# Patient Record
Sex: Female | Born: 1985
Health system: Southern US, Community
[De-identification: ages and names within clinical notes are randomized; demographics above are authoritative.]

## PROBLEM LIST (undated history)

## (undated) DIAGNOSIS — F419 Anxiety disorder, unspecified: Secondary | ICD-10-CM

## (undated) DIAGNOSIS — R002 Palpitations: Secondary | ICD-10-CM

## (undated) DIAGNOSIS — R200 Anesthesia of skin: Secondary | ICD-10-CM

## (undated) HISTORY — DX: Palpitations: R00.2

## (undated) HISTORY — PX: KNEE SURGERY: SHX244

## (undated) HISTORY — DX: Anxiety disorder, unspecified: F41.9

## (undated) HISTORY — DX: Anesthesia of skin: R20.0

---

## 2004-10-25 ENCOUNTER — Ambulatory Visit: Payer: Self-pay | Admitting: Family Medicine

## 2004-11-19 ENCOUNTER — Ambulatory Visit: Payer: Self-pay | Admitting: Family Medicine

## 2004-11-21 ENCOUNTER — Ambulatory Visit: Payer: Self-pay | Admitting: Family Medicine

## 2004-11-21 ENCOUNTER — Emergency Department (HOSPITAL_COMMUNITY): Admission: EM | Admit: 2004-11-21 | Discharge: 2004-11-21 | Payer: Self-pay | Admitting: Emergency Medicine

## 2014-07-12 ENCOUNTER — Encounter: Payer: Self-pay | Admitting: *Deleted

## 2014-07-12 ENCOUNTER — Emergency Department
Admission: EM | Admit: 2014-07-12 | Discharge: 2014-07-12 | Disposition: A | Discharge status: Home / Self Care | Payer: Managed Care, Other (non HMO) | Source: Home / Self Care | Attending: Emergency Medicine | Admitting: Emergency Medicine

## 2014-07-12 DIAGNOSIS — T63444A Toxic effect of venom of bees, undetermined, initial encounter: Secondary | ICD-10-CM

## 2014-07-12 MED ORDER — PREDNISONE 10 MG PO TABS: ORAL_TABLET | ORAL | Status: DC

## 2014-07-12 NOTE — ED Notes (Signed)
Pt c/o bee sting to her RT ankle with swelling x 3 days. She has taken IBF, Benadryl and applied ice.

## 2014-07-12 NOTE — Discharge Instructions (Signed)

## 2014-07-13 NOTE — ED Provider Notes (Signed)
CSN: [id number]     Arrival date & time 07/12/14  1845 History   First MD Initiated Contact with Patient 07/12/14 1907     Chief Complaint  Patient presents with  . Insect Bite     (Consider location/radiation/quality/duration/timing/severity/associated sxs/prior Treatment) Patient is a 29 y.o. female presenting with ankle pain. The history is provided by the patient. No language interpreter was used.  Ankle Pain Location:  Ankle Time since incident:  3 days Injury: no   Ankle location:  R ankle Pain details:    Quality:  Aching   Duration:  3 days   Progression:  Worsening Chronicity:  New Relieved by:  Nothing Worsened by:  Nothing tried Associated symptoms: swelling   Pt was stung by a bee on her right ankle.  Pt reports her ankle is swollen and painful.  History reviewed. No pertinent past medical history. Past Surgical History  Procedure Laterality Date  . Knee surgery     History reviewed. No pertinent family history. History  Substance Use Topics  . Smoking status: Never Smoker   . Smokeless tobacco: Not on file  . Alcohol Use: No   OB History    No data available     Review of Systems  Musculoskeletal: Positive for myalgias and joint swelling.  All other systems reviewed and are negative.     Allergies  Codeine and Morphine and related  Home Medications   Prior to Admission medications   Medication Sig Start Date End Date Taking? Authorizing Provider  predniSONE (DELTASONE) 10 MG tablet 6,5,4,3,2,1 taper 07/12/14   Lonia SkinnerLeslie K Sofia, PA-C   BP 116/80 mmHg  Pulse 79  Temp(Src) 98.4 F (36.9 C) (Oral)  Resp 16  Ht 5\' 10"  (1.778 m)  Wt 153 lb (69.4 kg)  BMI 21.95 kg/m2  SpO2 100%  LMP 06/22/2014 Physical Exam  Constitutional: She is oriented to person, place, and time. She appears well-developed and well-nourished.  Musculoskeletal: She exhibits tenderness.  Swollen right ankle, tender, no stingers.   Neurological: She is alert and oriented to  person, place, and time. She has normal reflexes.  Skin: There is erythema.  Nursing note and vitals reviewed.   ED Course  Procedures (including critical care time) Labs Review Labs Reviewed - No data to display  Imaging Review No results found.   EKG Interpretation None      MDM   Final diagnoses:  Bee sting, undetermined intent, initial encounter    Meds ordered this encounter  Medications  . predniSONE (DELTASONE) 10 MG tablet    Sig: 6,5,4,3,2,1 taper    Dispense:  21 tablet    Refill:  0    Order Specific Question:  Supervising Provider    Answer:  Georgina PillionMASSEY, DAVID [[id number]]    avs Benadryl See your Primary Md if symptoms persist.  Elson AreasLeslie K Sofia, PA-C 07/13/14 1703

## 2014-11-08 DIAGNOSIS — G43909 Migraine, unspecified, not intractable, without status migrainosus: Secondary | ICD-10-CM | POA: Insufficient documentation

## 2014-12-20 LAB — HM PAP SMEAR
HM Pap smear: NEGATIVE
HM Pap smear: NEGATIVE

## 2015-11-10 ENCOUNTER — Encounter: Payer: Self-pay | Admitting: Physician Assistant

## 2015-11-10 ENCOUNTER — Ambulatory Visit (INDEPENDENT_AMBULATORY_CARE_PROVIDER_SITE_OTHER): Payer: Managed Care, Other (non HMO) | Admitting: Physician Assistant

## 2015-11-10 VITALS — BP 121/80 | HR 89 | Ht 70.0 in | Wt 139.0 lb

## 2015-11-10 DIAGNOSIS — Z13220 Encounter for screening for lipoid disorders: Secondary | ICD-10-CM | POA: Diagnosis not present

## 2015-11-10 DIAGNOSIS — Z131 Encounter for screening for diabetes mellitus: Secondary | ICD-10-CM

## 2015-11-10 DIAGNOSIS — Z0000 Encounter for general adult medical examination without abnormal findings: Secondary | ICD-10-CM | POA: Diagnosis not present

## 2015-11-10 DIAGNOSIS — I498 Other specified cardiac arrhythmias: Secondary | ICD-10-CM | POA: Diagnosis not present

## 2015-11-10 DIAGNOSIS — Z23 Encounter for immunization: Secondary | ICD-10-CM

## 2015-11-10 LAB — LIPID PANEL
Cholesterol: 150 mg/dL (ref 125–200)
HDL: 62 mg/dL (ref >=46)
LDL CALC: 68 mg/dL (ref <130)
Total CHOL/HDL Ratio: 2.4 Ratio (ref <=5.0)
Triglycerides: 98 mg/dL (ref <150)
VLDL: 20 mg/dL (ref <30)

## 2015-11-10 LAB — COMPLETE METABOLIC PANEL WITH GFR
ALT: 24 U/L (ref 6–29)
AST: 21 U/L (ref 10–30)
Albumin: 4.5 g/dL (ref 3.6–5.1)
Alkaline Phosphatase: 51 U/L (ref 33–115)
BUN: 17 mg/dL (ref 7–25)
CHLORIDE: 103 mmol/L (ref 98–110)
CO2: 24 mmol/L (ref 20–31)
Calcium: 9.6 mg/dL (ref 8.6–10.2)
Creat: 0.88 mg/dL (ref 0.50–1.10)
GFR, EST NON AFRICAN AMERICAN: 87 mL/min (ref >=60)
GFR, Est African American: >89 mL/min (ref >=60)
Glucose, Bld: 86 mg/dL (ref 65–99)
Potassium: 4.2 mmol/L (ref 3.5–5.3)
Sodium: 138 mmol/L (ref 135–146)
Total Bilirubin: 1.1 mg/dL (ref 0.2–1.2)
Total Protein: 7.7 g/dL (ref 6.1–8.1)

## 2015-11-10 NOTE — Progress Notes (Signed)
Subjective:     Kelsey Dorsey is a 30 y.o. female and is here for a comprehensive physical exam. The patient reports no problems.  Social History   Social History  . Marital status: Single    Spouse name: N/A  . Number of children: N/A  . Years of education: N/A   Occupational History  . Not on file.   Social History Main Topics  . Smoking status: Never Smoker  . Smokeless tobacco: Never Used  . Alcohol use Yes  . Drug use: No  . Sexual activity: Not Currently   Other Topics Concern  . Not on file   Social History Narrative  . No narrative on file   Health Maintenance  Topic Date Due  . HIV Screening  01/14/1998  . TETANUS/TDAP  01/14/2002  . PAP SMEAR  01/15/2004  . INFLUENZA VACCINE  08/08/2015    The following portions of the patient's history were reviewed and updated as appropriate: allergies, current medications, past family history, past medical history, past social history, past surgical history and problem list.  Review of Systems A comprehensive review of systems was negative.   Objective:    BP 121/80   Pulse 89   Ht 5\' 10"  (1.778 m)   Wt 139 lb (63 kg)   LMP 11/01/2015   BMI 19.94 kg/m  General appearance: alert, cooperative and appears stated age Head: Normocephalic, without obvious abnormality, atraumatic Eyes: conjunctivae/corneas clear. PERRL, EOM's intact. Fundi benign. Ears: normal TM's and external ear canals both ears Nose: Nares normal. Septum midline. Mucosa normal. No drainage or sinus tenderness. Throat: lips, mucosa, and tongue normal; teeth and gums normal Neck: no adenopathy, no carotid bruit, no JVD, supple, symmetrical, trachea midline and thyroid not enlarged, symmetric, no tenderness/mass/nodules Back: symmetric, no curvature. ROM normal. No CVA tenderness. Lungs: clear to auscultation bilaterally Heart: regular rate and rhythm, S1, S2 normal, no murmur, click, rub or gallop Abdomen: soft, non-tender; bowel sounds normal; no  masses,  no organomegaly Extremities: extremities normal, atraumatic, no cyanosis or edema Pulses: 2+ and symmetric Skin: Skin color, texture, turgor normal. No rashes or lesions Lymph nodes: Cervical, supraclavicular, and axillary nodes normal. Neurologic: Alert and oriented X 3, normal strength and tone. Normal symmetric reflexes. Normal coordination and gait    Assessment:    Healthy female exam.      Plan:    Kelsey Dorsey.Kelsey Dorsey.Kelsey Dorsey was seen today for establish care and annual exam.  Diagnoses and all orders for this visit:  Routine physical examination -     Flu Vaccine QUAD 36+ mos PF IM (Fluarix & Fluzone Quad PF) -     Tdap vaccine greater than or equal to 7yo IM -     Lipid panel -     COMPLETE METABOLIC PANEL WITH GFR  Sinus arrhythmia seen on electrocardiogram  Screening for diabetes mellitus -     COMPLETE METABOLIC PANEL WITH GFR  Screening for lipid disorders -     Lipid panel   Discussed vitamin d 800- units and calcium 4 servings or 1500mg  daily.  Continue with regular exercise.   Discussed red flag symptoms of arrhthymias. Follow up as needed.  See After Visit Summary for Counseling Recommendations

## 2015-11-10 NOTE — Patient Instructions (Signed)

## 2016-01-31 ENCOUNTER — Other Ambulatory Visit: Payer: Self-pay | Admitting: Physician Assistant

## 2016-01-31 ENCOUNTER — Telehealth: Payer: Self-pay

## 2016-01-31 MED ORDER — CRYSELLE-28 0.3-30 MG-MCG PO TABS: 1.0000 | ORAL_TABLET | Freq: Every day | ORAL | 4 refills | Status: DC

## 2016-01-31 NOTE — Telephone Encounter (Signed)
Sent refills

## 2016-01-31 NOTE — Progress Notes (Signed)
Pt had pap in 11/2014. Was seen in office for CPE on 11/17. Ok for one year.

## 2016-01-31 NOTE — Telephone Encounter (Signed)
Kelsey Dorsey called for a refill on her birth control pill. Historical provider. Patient advised to call last office she had a PAP and have them fax us the results. Please advise.

## 2016-04-15 ENCOUNTER — Encounter: Payer: Self-pay | Admitting: Physician Assistant

## 2016-04-15 ENCOUNTER — Ambulatory Visit (INDEPENDENT_AMBULATORY_CARE_PROVIDER_SITE_OTHER): Payer: 59 | Admitting: Physician Assistant

## 2016-04-15 ENCOUNTER — Other Ambulatory Visit: Payer: Self-pay | Admitting: Physician Assistant

## 2016-04-15 VITALS — BP 125/78 | HR 86 | Ht 70.0 in | Wt 143.0 lb

## 2016-04-15 DIAGNOSIS — R0789 Other chest pain: Secondary | ICD-10-CM | POA: Diagnosis not present

## 2016-04-15 DIAGNOSIS — R002 Palpitations: Secondary | ICD-10-CM | POA: Diagnosis not present

## 2016-04-15 DIAGNOSIS — R079 Chest pain, unspecified: Secondary | ICD-10-CM

## 2016-04-15 LAB — CBC
HCT: 43.9 % (ref 35.0–45.0)
HEMOGLOBIN: 14.4 g/dL (ref 11.7–15.5)
MCH: 30.5 pg (ref 27.0–33.0)
MCHC: 32.8 g/dL (ref 32.0–36.0)
MCV: 93 fL (ref 80.0–100.0)
MPV: 9.5 fL (ref 7.5–12.5)
Platelets: 342 10*3/uL (ref 140–400)
RBC: 4.72 MIL/uL (ref 3.80–5.10)
RDW: 12.7 % (ref 11.0–15.0)
WBC: 7.3 10*3/uL (ref 3.8–10.8)

## 2016-04-15 LAB — BASIC METABOLIC PANEL WITH GFR
BUN: 9 mg/dL (ref 7–25)
CO2: 21 mmol/L (ref 20–31)
Calcium: 9.1 mg/dL (ref 8.6–10.2)
Chloride: 108 mmol/L (ref 98–110)
Creat: 0.82 mg/dL (ref 0.50–1.10)
GFR, Est African American: >89 mL/min (ref >=60)
GFR, Est Non African American: >89 mL/min (ref >=60)
Glucose, Bld: 82 mg/dL (ref 65–99)
Potassium: 4.6 mmol/L (ref 3.5–5.3)
Sodium: 142 mmol/L (ref 135–146)

## 2016-04-15 LAB — TSH: TSH: 1.21 m[IU]/L

## 2016-04-15 NOTE — Patient Instructions (Signed)
Prinzmetal angina.    

## 2016-04-15 NOTE — Progress Notes (Signed)
   Subjective:    Patient ID: Kelsey Dorsey, female    DOB: 1985/03/05, 31 y.o.   MRN: [id number]  HPI  Pt is a 31 yo female who presents to the clinic with mother to discuss sudden chest pain, pressure, tightness, and heart pounding that woke her up at 6:45 this morning. She felt like her left arm went numb during the attack. She felt some pain radiating into left back. She felt like she was being punched in the chest. She was coughing during attack but stopped after. She took 3 ASA and came to clinic. Last 1-2 hours but symptoms have resolved now. She only is sore. This has happened once before in October 2016. She went to cardiologist but did not do holter monitor. She denies any recent caffeine or medication changes that could have caused this. She is actively and exercises regularly. She denies any exertional chest pain.    Review of Systems    see HPI.  Objective:   Physical Exam  Constitutional: She is oriented to person, place, and time. She appears well-developed and well-nourished.  HENT:  Head: Normocephalic and atraumatic.  Cardiovascular: Normal rate, regular rhythm and normal heart sounds.   Pulmonary/Chest: Effort normal. She has wheezes. She exhibits no tenderness.  Neurological: She is alert and oriented to person, place, and time.  Psychiatric: She has a normal mood and affect. Her behavior is normal.          Assessment & Plan:  Marland KitchenMarland KitchenDiagnoses and all orders for this visit:  Palpitations -     EKG 12-Lead -     TSH -     CBC -     BASIC METABOLIC PANEL WITH GFR -     Ambulatory referral to Cardiology  Chest pain, unspecified type -     EKG 12-Lead -     TSH -     CBC -     BASIC METABOLIC PANEL WITH GFR -     Ambulatory referral to Cardiology  Chest tightness -     EKG 12-Lead -     TSH -     CBC -     BASIC METABOLIC PANEL WITH GFR -     Ambulatory referral to Cardiology   EKG today showed NSR with sinus arrhthymias. This is unchanged from past EKG.    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 discussed with patient. I would like to send to cardiology for there evaluation. Labs ordered to look for any thyroid issue.  Symptoms have resolved today. Ibuprofen as needed for chest wall tenderness.

## 2016-04-16 ENCOUNTER — Encounter: Payer: Self-pay | Admitting: Cardiology

## 2016-04-16 NOTE — Progress Notes (Signed)
Call pt: TSH is normal.  Kidney, liver, glucose looks great.  You are not anemic.

## 2016-04-17 ENCOUNTER — Encounter: Payer: Self-pay | Admitting: Cardiology

## 2016-04-17 ENCOUNTER — Ambulatory Visit: Payer: 59 | Admitting: Cardiology

## 2016-04-17 ENCOUNTER — Ambulatory Visit (INDEPENDENT_AMBULATORY_CARE_PROVIDER_SITE_OTHER): Payer: 59 | Admitting: Cardiology

## 2016-04-17 VITALS — BP 118/86 | HR 64 | Ht 70.0 in | Wt 146.1 lb

## 2016-04-17 DIAGNOSIS — R002 Palpitations: Secondary | ICD-10-CM

## 2016-04-17 DIAGNOSIS — R072 Precordial pain: Secondary | ICD-10-CM | POA: Diagnosis not present

## 2016-04-17 NOTE — Progress Notes (Signed)
Referring-Breeback, Jade L, PA-C Reason for referral-Palpitations and chest pain.  HPI: 31 year old female I am asked to consult on by Tandy Gaw for palpitations and chest pain. Laboratories 04/15/2016 showed TSH 1.21, potassium 4.6, hemoglobin 14.4. Patient has had 2 separate episodes one in October and one 2 days ago. She was awaking from sleep with sudden onset of heart racing and chest pain. There was dyspnea as well. No presyncope or syncope. Each episode lasted approximately 45 minutes and resolved with coughing. She was seen at [facility] after the first episode and was told it may have been an anxiety attack. Note her symptoms resolved prior to arrival. She was seen in primary care office after the most recent episode and again her symptoms had resolved. She otherwise does not have dyspnea on exertion, orthopnea, PND, pedal edema, exertional chest pain or syncope.  Current Outpatient Prescriptions  Medication Sig Dispense Refill  . CRYSELLE-28 0.3-30 MG-MCG tablet Take 1 tablet by mouth daily. 3 Package 4   No current facility-administered medications for this visit.     Allergies  Allergen Reactions  . Codeine Itching  . Cephalexin Other (See Comments)  . Morphine And Related Other (See Comments)     Past Medical History:  Diagnosis Date  . Palpitations     Past Surgical History:  Procedure Laterality Date  . KNEE SURGERY      Social History   Social History  . Marital status: Single    Spouse name: N/A  . Number of children: N/A  . Years of education: N/A   Occupational History  .      Financial planner for [employer]   Social History Main Topics  . Smoking status: Never Smoker  . Smokeless tobacco: Never Used  . Alcohol use Yes     Comment: Rare  . Drug use: No  . Sexual activity: Not Currently   Other Topics Concern  . Not on file   Social History Narrative  . No narrative on file    Family History  Problem Relation Age of Onset  .  Hyperlipidemia Mother   . Hypertension Mother   . Diabetes Mother   . Heart attack Mother   . Depression Father     ROS: no fevers or chills, productive cough, hemoptysis, dysphasia, odynophagia, melena, hematochezia, dysuria, hematuria, rash, seizure activity, orthopnea, PND, pedal edema, claudication. Remaining systems are negative.  Physical Exam:   Blood pressure 118/86, pulse 64, height  (1.778 m), weight 146 lb 1.9 oz (66.3 kg).  General:  Well developed/well nourished in NAD Skin warm/dry Patient not depressed No peripheral clubbing Back-normal HEENT-normal/normal eyelids Neck supple/normal carotid upstroke bilaterally; no bruits; no JVD; no thyromegaly chest - CTA/ normal expansion CV - RRR/normal S1 and S2; no murmurs, rubs or gallops;  PMI nondisplaced Abdomen -NT/ND, no HSM, no mass, + bowel sounds, no bruit 2+ femoral pulses, no bruits Ext-no edema, chords, 2+ DP Neuro-grossly nonfocal  ECG - sinus rhythm at a rate of 64. Right axis deviation. RV conduction delay. personally reviewed  A/P  1 palpitations-symptoms sound likely to be supraventricular tachycardia. They have been infrequent to date. I have recommended alivecor see if we can capture an episode of palpitations. She has SVT will add a beta blocker versus refer for ablation. Schedule echocardiogram to assess LV function. We discussed Valsalva maneuver to potentially terminate episodes if they occur.  2. Chest pain-symptoms only occur with palpitations but no other exertional symptoms and electrocardiogram normal.  Schedule echocardiogram to assess LV function.  Olga Millers, MD

## 2016-04-17 NOTE — Patient Instructions (Signed)
Medication Instructions:   NO CHANGE  Testing/Procedures:  Your physician has requested that you have an echocardiogram. Echocardiography is a painless test that uses sound waves to create images of your heart. It provides your doctor with information about the size and shape of your heart and how well your heart's chambers and valves are working. This procedure takes approximately one hour. There are no restrictions for this procedure.    Follow-Up:  Your physician wants you to follow-up in: 6 MONTHS WITH DR CRENSHAW You will receive a reminder letter in the mail two months in advance. If you don't receive a letter, please call our office to schedule the follow-up appointment.      

## 2016-04-30 ENCOUNTER — Encounter: Payer: Self-pay | Admitting: Physician Assistant

## 2016-05-01 ENCOUNTER — Ambulatory Visit (HOSPITAL_COMMUNITY): Payer: 59 | Attending: Cardiology

## 2016-05-01 ENCOUNTER — Other Ambulatory Visit: Payer: Self-pay

## 2016-05-01 DIAGNOSIS — I341 Nonrheumatic mitral (valve) prolapse: Secondary | ICD-10-CM | POA: Insufficient documentation

## 2016-05-01 DIAGNOSIS — R002 Palpitations: Secondary | ICD-10-CM | POA: Diagnosis not present

## 2016-07-18 ENCOUNTER — Encounter: Payer: Self-pay | Admitting: Family Medicine

## 2016-07-18 ENCOUNTER — Ambulatory Visit (INDEPENDENT_AMBULATORY_CARE_PROVIDER_SITE_OTHER): Payer: 59

## 2016-07-18 ENCOUNTER — Ambulatory Visit (INDEPENDENT_AMBULATORY_CARE_PROVIDER_SITE_OTHER): Payer: 59 | Admitting: Family Medicine

## 2016-07-18 VITALS — BP 116/84 | HR 86 | Wt 145.0 lb

## 2016-07-18 DIAGNOSIS — R079 Chest pain, unspecified: Secondary | ICD-10-CM | POA: Diagnosis not present

## 2016-07-18 DIAGNOSIS — R0789 Other chest pain: Secondary | ICD-10-CM | POA: Diagnosis not present

## 2016-07-18 DIAGNOSIS — R002 Palpitations: Secondary | ICD-10-CM

## 2016-07-18 LAB — COMPLETE METABOLIC PANEL WITH GFR
ALT: 16 U/L (ref 6–29)
AST: 16 U/L (ref 10–30)
Albumin: 4.3 g/dL (ref 3.6–5.1)
Alkaline Phosphatase: 54 U/L (ref 33–115)
BILIRUBIN TOTAL: 0.6 mg/dL (ref 0.2–1.2)
BUN: 11 mg/dL (ref 7–25)
CHLORIDE: 101 mmol/L (ref 98–110)
CO2: 24 mmol/L (ref 20–31)
Calcium: 9 mg/dL (ref 8.6–10.2)
Creat: 0.77 mg/dL (ref 0.50–1.10)
GFR, Est Non African American: >89 mL/min (ref >=60)
GLUCOSE: 79 mg/dL (ref 65–99)
Potassium: 3.9 mmol/L (ref 3.5–5.3)
Sodium: 137 mmol/L (ref 135–146)
Total Protein: 7.4 g/dL (ref 6.1–8.1)

## 2016-07-18 LAB — CBC
HCT: 42.2 % (ref 35.0–45.0)
Hemoglobin: 13.6 g/dL (ref 11.7–15.5)
MCH: 30.6 pg (ref 27.0–33.0)
MCHC: 32.2 g/dL (ref 32.0–36.0)
MCV: 94.8 fL (ref 80.0–100.0)
MPV: 9.2 fL (ref 7.5–12.5)
Platelets: 366 10*3/uL (ref 140–400)
RBC: 4.45 MIL/uL (ref 3.80–5.10)
RDW: 13.4 % (ref 11.0–15.0)
WBC: 10.1 10*3/uL (ref 3.8–10.8)

## 2016-07-18 LAB — MAGNESIUM: MAGNESIUM: 1.8 mg/dL (ref 1.5–2.5)

## 2016-07-18 LAB — EKG 12-LEAD

## 2016-07-18 MED ORDER — OMEPRAZOLE 40 MG PO CPDR: 40.0000 mg | DELAYED_RELEASE_CAPSULE | Freq: Every day | ORAL | 3 refills | Status: DC

## 2016-07-18 MED ORDER — SUCRALFATE 1 G PO TABS: 1.0000 g | ORAL_TABLET | Freq: Four times a day (QID) | ORAL | 0 refills | Status: DC

## 2016-07-18 MED ORDER — ONDANSETRON HCL 4 MG PO TABS: 4.0000 mg | ORAL_TABLET | Freq: Three times a day (TID) | ORAL | 0 refills | Status: DC | PRN

## 2016-07-18 NOTE — Progress Notes (Signed)
Kelsey Dorsey is a 31 y.o. female who [facility]: [facility] today for chest pain.  Patient reports 2.5 hours of stabbing chest pain localized to the substernal region. She endorses some shortness of breath, nausea, and 1 episode of emesis. The patient was not exerting herself when the pain began and it did not resolve with rest. She rates the pain as 8/10 in severity. She denies any change in pain with body position or when taking a deep breath. She denies any palpitations, diaphoresis, fevers, chills, or weight loss. Patient denies any jaw or arm radiation of the pain. She denies any previous episodes similar to this one, but reports that she has previously been told that she has an irregular heart beat and atrial fibrillation. She denies any recent injuries, periods of immobilization, or travel. Patient denies any recent viral illnesses, sick contacts, changes in stool frequency, headaches, vision changes, changes in urination, or cough.   Past Medical History:  Diagnosis Date  . Palpitations    Past Surgical History:  Procedure Laterality Date  . KNEE SURGERY     Social History  Substance Use Topics  . Smoking status: Never Smoker  . Smokeless tobacco: Never Used  . Alcohol use Yes     Comment: Rare   family history includes Depression in her father; Diabetes in her mother; Heart attack in her mother; Hyperlipidemia in her mother; Hypertension in her mother.  ROS as above:  Medications: Current Outpatient Prescriptions  Medication Sig Dispense Refill  . CRYSELLE-28 0.3-30 MG-MCG tablet Take 1 tablet by mouth daily. 3 Package 4  . omeprazole (PRILOSEC) 40 MG capsule Take 1 capsule (40 mg total) by mouth daily. 30 capsule 3  . ondansetron (ZOFRAN) 4 MG tablet Take 1 tablet (4 mg total) by mouth every 8 (eight) hours as needed for nausea or vomiting. 20 tablet 0    . sucralfate (CARAFATE) 1 g tablet Take 1 tablet (1 g total) by mouth 4 (four) times daily. 120 tablet 0   No current facility-administered medications for this visit.    Allergies  Allergen Reactions  . Codeine Itching  . Cephalexin Other (See Comments)  . Morphine And Related Other (See Comments)    Health Maintenance Health Maintenance  Topic Date Due  . HIV Screening  01/14/1998  . INFLUENZA VACCINE  08/07/2016  . PAP SMEAR  12/19/2017  . TETANUS/TDAP  11/09/2025     Exam:  BP 116/84   Pulse 86   Wt 145 lb (65.8 kg)   LMP 07/16/2016   SpO2 99%   BMI 20.81 kg/m  Gen: Well NAD HEENT: EOMI,  MMM Lungs: Normal work of breathing. CTABL Heart: RRR no MRG Abd: NABS, Soft. Nondistended, Nontender Exts: Brisk capillary refill, warm and well perfused.   Twelve-lead EKG shows normal sinus rhythm at 70 bpm. Slight rightward axis. No ST segment elevation or depression. Normal T waves. QTC 460. EKG is not significantly changed from EKG from April 2018.  Patient was given a GI cocktail which did not help much at all.  No results found for this or any previous visit (from the past 72 hour(s)). Dg Chest 2 View  Result Date: 07/18/2016 CLINICAL DATA:  Mid and left side chest pain beginning this morning. EXAM: CHEST  2 VIEW COMPARISON:  PA and lateral chest 11/21/2004. FINDINGS: The lungs are clear. Heart size is normal. No pneumothorax or pleural effusion. Mild pectus deformity is  noted. No acute bony abnormality. IMPRESSION: No acute disease. Electronically Signed   By: Drusilla Kanner M.D.   On: 07/18/2016 11:47      Assessment and Plan: 31 y.o. female with chest pain. Given the acute onset of her pain and associated symptoms, cardiac etiologies should be further evaluated. A chest x-ray and EKG were performed. Chest x-ray was unremarkable, currently pending final read by radiology. EKG demonstrated normal sinus rhythm with right axis deviation which is consistent with  previous EKG dated 04/24/2016. A basic metabolic panel will also be done to evaluate for any electrolyte abnormalities. Other potential causes of chest pain include pulmonary embolism, GI, and musculoskeletal etiologies. PE is less likely given patient's normal lower extremity exam and overall lack of risk factors, although she does take OCPs. A d-dimer will be done to further assess and potentially rule out. Patient was given GI cocktail and monitored for response to treatment. She did not improve with GI cocktail I'm doubtful for esophagitis however think is reasonable to treat with omeprazole and Carafate. Additionally she's had some vomiting and therefore I'll prescribe Zofran as well.  She will continue to wear cardiac monitor from her cardiologist for the remainder of the prescribed period. She should undergo a cardiac stress test at some point, but this is not necessary for this visit.     Orders Placed This Encounter  Procedures  . DG Chest 2 View    Order Specific Question:   Reason for exam:    Answer:   Cough, assess intra-thoracic pathology    Order Specific Question:   Is the patient pregnant?    Answer:   No    Order Specific Question:   Preferred imaging location?    Answer:   [facility]  . CBC  . COMPLETE METABOLIC PANEL WITH GFR  . D-dimer, quantitative (not at [facility])  . Magnesium   Meds ordered this encounter  Medications  . omeprazole (PRILOSEC) 40 MG capsule    Sig: Take 1 capsule (40 mg total) by mouth daily.    Dispense:  30 capsule    Refill:  3  . ondansetron (ZOFRAN) 4 MG tablet    Sig: Take 1 tablet (4 mg total) by mouth every 8 (eight) hours as needed for nausea or vomiting.    Dispense:  20 tablet    Refill:  0  . sucralfate (CARAFATE) 1 g tablet    Sig: Take 1 tablet (1 g total) by mouth 4 (four) times daily.    Dispense:  120 tablet    Refill:  0     Discussed warning signs or symptoms. Please see discharge instructions. Patient expresses  understanding.  I spent 40 minutes with this patient, greater than 50% was face-to-face time counseling regarding the above diagnosis.

## 2016-07-18 NOTE — Patient Instructions (Addendum)
Thank you for coming in today. Please return to clinic next week.  Return sooner if needed.  Call or go to the emergency room if you get worse, have trouble breathing, have chest pains, or palpitations.   Get labs today.  Start carafte and omeprazole now.  USe zofran as needed.      Nonspecific Chest Pain Chest pain can be caused by many different conditions. There is always a chance that your pain could be related to something serious, such as a heart attack or a blood clot in your lungs. Chest pain can also be caused by conditions that are not life-threatening. If you have chest pain, it is very important to follow up with your health care provider. What are the causes? Causes of this condition include:  Heartburn.  Pneumonia or bronchitis.  Anxiety or stress.  Inflammation around your heart (pericarditis) or lung (pleuritis or pleurisy).  A blood clot in your lung.  A collapsed lung (pneumothorax). This can develop suddenly on its own (spontaneous pneumothorax) or from trauma to the chest.  Shingles infection (varicella-zoster virus).  Heart attack.  Damage to the bones, muscles, and cartilage that make up your chest wall. This can include: ? Bruised bones due to injury. ? Strained muscles or cartilage due to frequent or repeated coughing or overwork. ? Fracture to one or more ribs. ? Sore cartilage due to inflammation (costochondritis).  What increases the risk? Risk factors for this condition may include:  Activities that increase your risk for trauma or injury to your chest.  Respiratory infections or conditions that cause frequent coughing.  Medical conditions or overeating that can cause heartburn.  Heart disease or family history of heart disease.  Conditions or health behaviors that increase your risk of developing a blood clot.  Having had chicken pox (varicella zoster).  What are the signs or symptoms? Chest pain can feel like:  Burning or tingling  on the surface of your chest or deep in your chest.  Crushing, pressure, aching, or squeezing pain.  Dull or sharp pain that is worse when you move, cough, or take a deep breath.  Pain that is also felt in your back, neck, shoulder, or arm, or pain that spreads to any of these areas.  Your chest pain may come and go, or it may stay constant. How is this diagnosed? Lab tests or other studies may be needed to find the cause of your pain. Your health care provider may have you take a test called an ECG (electrocardiogram). An ECG records your heartbeat patterns at the time the test is performed. You may also have other tests, such as:  Transthoracic echocardiogram (TTE). In this test, sound waves are used to create a picture of the heart structures and to look at how blood flows through your heart.  Transesophageal echocardiogram (TEE).This is a more advanced imaging test that takes images from inside your body. It allows your health care provider to see your heart in finer detail.  Cardiac monitoring. This allows your health care provider to monitor your heart rate and rhythm in real time.  Holter monitor. This is a portable device that records your heartbeat and can help to diagnose abnormal heartbeats. It allows your health care provider to track your heart activity for several days, if needed.  Stress tests. These can be done through exercise or by taking medicine that makes your heart beat more quickly.  Blood tests.  Other imaging tests.  How is this treated? Treatment  depends on what is causing your chest pain. Treatment may include:  Medicines. These may include: ? Acid blockers for heartburn. ? Anti-inflammatory medicine. ? Pain medicine for inflammatory conditions. ? Antibiotic medicine, if an infection is present. ? Medicines to dissolve blood clots. ? Medicines to treat coronary artery disease (CAD).  Supportive care for conditions that do not require medicines. This  may include: ? Resting. ? Applying heat or cold packs to injured areas. ? Limiting activities until pain decreases.  Follow these instructions at home: Medicines  If you were prescribed an antibiotic, take it as told by your health care provider. Do not stop taking the antibiotic even if you start to feel better.  Take over-the-counter and prescription medicines only as told by your health care provider. Lifestyle  Do not use any products that contain nicotine or tobacco, such as cigarettes and e-cigarettes. If you need help quitting, ask your health care provider.  Do not drink alcohol.  Make lifestyle changes as directed by your health care provider. These may include: ? Getting regular exercise. Ask your health care provider to suggest some activities that are safe for you. ? Eating a heart-healthy diet. A registered dietitian can help you to learn healthy eating options. ? Maintaining a healthy weight. ? Managing diabetes, if necessary. ? Reducing stress, such as with yoga or relaxation techniques. General instructions  Avoid any activities that bring on chest pain.  If heartburn is the cause for your chest pain, raise (elevate) the head of your bed about 6 inches (15 cm) by putting blocks under the legs. Sleeping with more pillows does not effectively relieve heartburn because it only changes the position of your head.  Keep all follow-up visits as told by your health care provider. This is important. This includes any further testing if your chest pain does not go away. Contact a health care provider if:  Your chest pain does not go away.  You have a rash with blisters on your chest.  You have a fever.  You have chills. Get help right away if:  Your chest pain is worse.  You have a cough that gets worse, or you cough up blood.  You have severe pain in your abdomen.  You have severe weakness.  You faint.  You have sudden, unexplained chest discomfort.  You  have sudden, unexplained discomfort in your arms, back, neck, or jaw.  You have shortness of breath at any time.  You suddenly start to sweat, or your skin gets clammy.  You feel nauseous or you vomit.  You suddenly feel light-headed or dizzy.  Your heart begins to beat quickly, or it feels like it is skipping beats. These symptoms may represent a serious problem that is an emergency. Do not wait to see if the symptoms will go away. Get medical help right away. Call your local emergency services (911 in the [place] Do not drive yourself to the hospital. This information is not intended to replace advice given to you by your health care provider. Make sure you discuss any questions you have with your health care provider. Document Released: 10/03/2004 Document Revised: 09/18/2015 Document Reviewed: 09/18/2015 Elsevier Interactive Patient Education  2017 [employer].

## 2016-07-19 LAB — D-DIMER, QUANTITATIVE: D-Dimer, Quant: 0.35 mcg/mL FEU (ref <0.50)

## 2016-07-25 ENCOUNTER — Encounter: Payer: Self-pay | Admitting: Family Medicine

## 2016-07-25 ENCOUNTER — Ambulatory Visit (INDEPENDENT_AMBULATORY_CARE_PROVIDER_SITE_OTHER): Payer: 59 | Admitting: Family Medicine

## 2016-07-25 VITALS — BP 118/82 | HR 88 | Ht 70.0 in | Wt 147.0 lb

## 2016-07-25 DIAGNOSIS — R002 Palpitations: Secondary | ICD-10-CM | POA: Diagnosis not present

## 2016-07-25 MED ORDER — METOPROLOL SUCCINATE ER 25 MG PO TB24: 25.0000 mg | ORAL_TABLET | Freq: Every day | ORAL | 3 refills | Status: DC

## 2016-07-25 NOTE — Patient Instructions (Addendum)
Thank you for coming in today. We will keep an eye on symptoms.  Return as needed.   Lets try low dose metoprolol daily.  Let me know if you feel bad, cant work out, get lightheaded or dizzy.   Follow up with cardiology.     Metoprolol extended-release tablets What is this medicine? METOPROLOL (me TOE proe lole) is a beta-blocker. Beta-blockers reduce the workload on the heart and help it to beat more regularly. This medicine is used to treat high blood pressure and to prevent chest pain. It is also used to after a heart attack and to prevent an additional heart attack from occurring. This medicine may be used for other purposes; ask your health care provider or pharmacist if you have questions. COMMON BRAND NAME(S): toprol, Toprol XL What should I tell my health care provider before I take this medicine? They need to know if you have any of these conditions: -diabetes -heart or vessel disease like slow heart rate, worsening heart failure, heart block, sick sinus syndrome or Raynaud's disease -kidney disease -liver disease -lung or breathing disease, like asthma or emphysema -pheochromocytoma -thyroid disease -an unusual or allergic reaction to metoprolol, other beta-blockers, medicines, foods, dyes, or preservatives -pregnant or trying to get pregnant -breast-feeding How should I use this medicine? Take this medicine by mouth with a glass of water. Follow the directions on the prescription label. Do not crush or chew. Take this medicine with or immediately after meals. Take your doses at regular intervals. Do not take more medicine than directed. Do not stop taking this medicine suddenly. This could lead to serious heart-related effects. Talk to your pediatrician regarding the use of this medicine in children. While this drug may be prescribed for children as young as 6 years for selected conditions, precautions do apply. Overdosage: If you think you have taken too much of this  medicine contact a poison control center or emergency room at once. NOTE: This medicine is only for you. Do not share this medicine with others. What if I miss a dose? If you miss a dose, take it as soon as you can. If it is almost time for your next dose, take only that dose. Do not take double or extra doses. What may interact with this medicine? This medicine may interact with the following medications: -certain medicines for blood pressure, heart disease, irregular heart beat -certain medicines for depression, like monoamine oxidase (MAO) inhibitors, fluoxetine, or paroxetine -clonidine -dobutamine -epinephrine -isoproterenol -reserpine This list may not describe all possible interactions. Give your health care provider a list of all the medicines, herbs, non-prescription drugs, or dietary supplements you use. Also tell them if you smoke, drink alcohol, or use illegal drugs. Some items may interact with your medicine. What should I watch for while using this medicine? Visit your doctor or health care professional for regular check ups. Contact your doctor right away if your symptoms worsen. Check your blood pressure and pulse rate regularly. Ask your health care professional what your blood pressure and pulse rate should be, and when you should contact them. You may get drowsy or dizzy. Do not drive, use machinery, or do anything that needs mental alertness until you know how this medicine affects you. Do not sit or stand up quickly, especially if you are an older patient. This reduces the risk of dizzy or fainting spells. Contact your doctor if these symptoms continue. Alcohol may interfere with the effect of this medicine. Avoid alcoholic drinks. What side effects  may I notice from receiving this medicine? Side effects that you should report to your doctor or health care professional as soon as possible: -allergic reactions like skin rash, itching or hives -cold or numb hands or  feet -depression -difficulty breathing -faint -fever with sore throat -irregular heartbeat, chest pain -rapid weight gain -swollen legs or ankles Side effects that usually do not require medical attention (report to your doctor or health care professional if they continue or are bothersome): -anxiety or nervousness -change in sex drive or performance -dry skin -headache -nightmares or trouble sleeping -short term memory loss -stomach upset or diarrhea -unusually tired This list may not describe all possible side effects. Call your doctor for medical advice about side effects. You may report side effects to FDA at 1-800-FDA-1088. Where should I keep my medicine? Keep out of the reach of children. Store at room temperature between 15 and 30 degrees C (59 and 86 degrees F). Throw away any unused medicine after the expiration date. NOTE: This sheet is a summary. It may not cover all possible information. If you have questions about this medicine, talk to your doctor, pharmacist, or health care provider.  2018 Elsevier/Gold Standard (2012-08-28 14:41:37)

## 2016-07-25 NOTE — Progress Notes (Signed)
Kelsey Dorsey is a 31 y.o. female who presents to [facility] today for follow-up of chest pain.  Patient presents for follow-up after an episode of chest pain 1 week ago. Patient denies any additional episodes of chest pain. She resumed her normal activities after 1 day and has not had any issues maintaining her usual work schedule or workout regimen. Patient reports that she took omeprazole, ondansetron, and sucralfate for 1 day, but did not feel she needed these medications and has since quite taking them. Patient denies any shortness of breath, vision changes, lightheadedness, headache, syncope, reflux, abdominal pain, changes in bowel movements, or leg swelling.   Her brother is a Teacher, early years/pre and suggested that she consider a beta blocker.  Past Medical History:  Diagnosis Date  . Palpitations    Past Surgical History:  Procedure Laterality Date  . KNEE SURGERY     Social History  Substance Use Topics  . Smoking status: Never Smoker  . Smokeless tobacco: Never Used  . Alcohol use Yes     Comment: Rare   family history includes Depression in her father; Diabetes in her mother; Heart attack in her mother; Hyperlipidemia in her mother; Hypertension in her mother.  ROS as above:  Medications: Current Outpatient Prescriptions  Medication Sig Dispense Refill  . CRYSELLE-28 0.3-30 MG-MCG tablet Take 1 tablet by mouth daily. 3 Package 4  . ondansetron (ZOFRAN) 4 MG tablet Take 1 tablet (4 mg total) by mouth every 8 (eight) hours as needed for nausea or vomiting. 20 tablet 0  . metoprolol succinate (TOPROL-XL) 25 MG 24 hr tablet Take 1 tablet (25 mg total) by mouth daily. 90 tablet 3   No current facility-administered medications for this visit.    Allergies  Allergen Reactions  . Codeine Itching  . Cephalexin Other (See Comments)  . Morphine And Related Other  (See Comments)    Health Maintenance Health Maintenance  Topic Date Due  . HIV Screening  01/14/1998  . INFLUENZA VACCINE  08/07/2016  . PAP SMEAR  12/19/2017  . TETANUS/TDAP  11/09/2025     Exam:  BP 118/82   Pulse 88   Ht 5\' 10"  (1.778 m)   Wt 147 lb (66.7 kg)   LMP 07/16/2016   BMI 21.09 kg/m  Gen: Well NAD HEENT: EOMI,  MMM Lungs: Normal work of breathing. CTABL Heart: RRR, normal S1 and S2, no MRG Abd: NABS, Soft. Nondistended, Nontender Exts: Brisk capillary refill, warm and well perfused.    No results found for this or any previous visit (from the past 72 hour(s)). No results found.    Assessment and Plan: 31 y.o. female presenting for 1 week follow-up of chest pain/palpitations. Patient has not had any additional episodes of chest pain. No further workup is indicated at this time. She has scheduled an appointment with a cardiologist in October.   I think it is reasonable to use low-dose metoprolol to prevent what we presume are nonsustained SVT.  This was suggested in a note by Dr. Jens Som on April.  Patient was started on metoprolol succinate 25 mg and instructed to stop this medication if she experiences any lightheadedness or dizziness.    No orders of the defined types were placed in this encounter.  Meds ordered this encounter  Medications  . metoprolol succinate (TOPROL-XL) 25 MG 24 hr tablet    Sig: Take 1 tablet (25 mg total) by mouth daily.  Dispense:  90 tablet    Refill:  3     Discussed warning signs or symptoms. Please see discharge instructions. Patient expresses understanding.

## 2016-08-02 NOTE — Addendum Note (Signed)
Addended by: Baird KayUGLAS, Esgar Barnick M on: 08/02/2016 09:51 AM   Modules accepted: Orders

## 2016-08-14 ENCOUNTER — Other Ambulatory Visit: Payer: Self-pay | Admitting: Family Medicine

## 2016-09-07 ENCOUNTER — Other Ambulatory Visit: Payer: Self-pay | Admitting: Family Medicine

## 2016-10-27 ENCOUNTER — Encounter: Payer: Self-pay | Admitting: Emergency Medicine

## 2016-10-27 ENCOUNTER — Emergency Department (INDEPENDENT_AMBULATORY_CARE_PROVIDER_SITE_OTHER)
Admission: EM | Admit: 2016-10-27 | Discharge: 2016-10-27 | Disposition: A | Discharge status: Home / Self Care | Payer: 59 | Source: Home / Self Care | Attending: Family Medicine | Admitting: Family Medicine

## 2016-10-27 DIAGNOSIS — H6693 Otitis media, unspecified, bilateral: Secondary | ICD-10-CM

## 2016-10-27 DIAGNOSIS — J029 Acute pharyngitis, unspecified: Secondary | ICD-10-CM

## 2016-10-27 DIAGNOSIS — J069 Acute upper respiratory infection, unspecified: Secondary | ICD-10-CM

## 2016-10-27 MED ORDER — AMOXICILLIN-POT CLAVULANATE 875-125 MG PO TABS: 1.0000 | ORAL_TABLET | Freq: Two times a day (BID) | ORAL | 0 refills | Status: DC

## 2016-10-27 NOTE — ED Triage Notes (Signed)
[facility] with a complaint of Sore Throat, Chills and Cough x 1 week. Denies fever.

## 2016-10-27 NOTE — Discharge Instructions (Signed)
°  You may take 500mg acetaminophen every 4-6 hours or in combination with ibuprofen 400-600mg every 6-8 hours as needed for pain, inflammation, and fever. ° °Be sure to drink at least eight 8oz glasses of water to stay well hydrated and get at least 8 hours of sleep at night, preferably more while sick.  ° °Please take antibiotics as prescribed and be sure to complete entire course even if you start to feel better to ensure infection does not come back. ° °

## 2016-10-27 NOTE — ED Provider Notes (Signed)
[facility]    CSN: [id number] Arrival date & time: 10/27/16  1114     History   Chief Complaint Chief Complaint  Patient presents with  . Sore Throat    HPI Kelsey Dorsey is a 31 y.o. female.   HPI  Kelsey Dorsey is a 31 y.o. female presenting to UC with c/o bilateral ear pain and fullness, nasal congestion, sore throat, body aches, chills, and mildly productive coguh for 1 week. Others at work have also been sick. She has taken an OTC cough/cold medication with mild relief. Denies n/v/d. No known fever.    Past Medical History:  Diagnosis Date  . Palpitations     Patient Active Problem List   Diagnosis Date Noted  . Palpitations 04/15/2016  . Chest pain 04/15/2016  . Chest tightness 04/15/2016  . Sinus arrhythmia seen on electrocardiogram 11/10/2015  . Migraine 11/08/2014    Past Surgical History:  Procedure Laterality Date  . KNEE SURGERY      OB History    No data available       Home Medications    Prior to Admission medications   Medication Sig Start Date End Date Taking? Authorizing Provider  CRYSELLE-28 0.3-30 MG-MCG tablet Take 1 tablet by mouth daily. 01/31/16  Yes Breeback, Jade L, PA-C  metoprolol succinate (TOPROL-XL) 25 MG 24 hr tablet Take 1 tablet (25 mg total) by mouth daily. 07/25/16  Yes Rodolph Bongorey, Evan S, MD  amoxicillin-clavulanate (AUGMENTIN) 875-125 MG tablet Take 1 tablet by mouth 2 (two) times daily. One po bid x 7 days 10/27/16   Lurene ShadowPhelps, Maston Wight O, PA-C    Family History Family History  Problem Relation Age of Onset  . Hyperlipidemia Mother   . Hypertension Mother   . Diabetes Mother   . Heart attack Mother   . Depression Father     Social History Social History  Substance Use Topics  . Smoking status: Never Smoker  . Smokeless tobacco: Never Used  . Alcohol use Yes     Comment: Rare     Allergies   Codeine; Cephalexin; and Morphine and related   Review of Systems Review of Systems  Constitutional:  Positive for chills. Negative for fever.  HENT: Positive for congestion, ear pain, postnasal drip, rhinorrhea, sinus pressure and sore throat. Negative for trouble swallowing and voice change.   Respiratory: Positive for cough. Negative for shortness of breath.   Cardiovascular: Negative for chest pain and palpitations.  Gastrointestinal: Negative for abdominal pain, diarrhea, nausea and vomiting.  Musculoskeletal: Positive for arthralgias and myalgias. Negative for back pain.  Skin: Negative for rash.  Neurological: Positive for headaches. Negative for dizziness and light-headedness.     Physical Exam Triage Vital Signs ED Triage Vitals  Enc Vitals Group     BP 10/27/16 1139 109/74     Pulse Rate 10/27/16 1139 99     Resp --      Temp 10/27/16 1139 98.4 F (36.9 C)     Temp Source 10/27/16 1139 Oral     SpO2 10/27/16 1139 100 %     Weight 10/27/16 1139 140 lb (63.5 kg)     Height 10/27/16 1139 5\' 10"  (1.778 m)     Head Circumference --      Peak Flow --      Pain Score 10/27/16 1140 6     Pain Loc --      Pain Edu? --      Excl. in GC? --  No data found.   Updated Vital Signs BP 109/74 (BP Location: Left Arm)   Pulse 99   Temp 98.4 F (36.9 C) (Oral)   Ht 5\' 10"  (1.778 m)   Wt 140 lb (63.5 kg)   SpO2 100%   BMI 20.09 kg/m   Visual Acuity Right Eye Distance:   Left Eye Distance:   Bilateral Distance:    Right Eye Near:   Left Eye Near:    Bilateral Near:     Physical Exam  Constitutional: She is oriented to person, place, and time. She appears well-developed and well-nourished. No distress.  HENT:  Head: Normocephalic and atraumatic.  Right Ear: Tympanic membrane is bulging. Tympanic membrane is not erythematous. A middle ear effusion is present.  Left Ear: Tympanic membrane is not erythematous and not bulging. A middle ear effusion is present.  Nose: Mucosal edema present. Right sinus exhibits no maxillary sinus tenderness and no frontal sinus  tenderness. Left sinus exhibits no maxillary sinus tenderness and no frontal sinus tenderness.  Mouth/Throat: Uvula is midline and mucous membranes are normal. Posterior oropharyngeal erythema present. No oropharyngeal exudate, posterior oropharyngeal edema or tonsillar abscesses.  Eyes: EOM are normal.  Neck: Normal range of motion.  Cardiovascular: Normal rate and regular rhythm.   Pulmonary/Chest: Effort normal. No respiratory distress. She has no wheezes. She has no rales.  Musculoskeletal: Normal range of motion.  Neurological: She is alert and oriented to person, place, and time.  Skin: Skin is warm and dry. She is not diaphoretic.  Psychiatric: She has a normal mood and affect. Her behavior is normal.  Nursing note and vitals reviewed.    UC Treatments / Results  Labs (all labs ordered are listed, but only abnormal results are displayed) Labs Reviewed  POCT RAPID STREP A (OFFICE)    EKG  EKG Interpretation None       Radiology No results found.  Procedures Procedures (including critical care time)  Medications Ordered in UC Medications - No data to display   Initial Impression / Assessment and Plan / UC Course  I have reviewed the triage vital signs and the nursing notes.  Pertinent labs & imaging results that were available during my care of the patient were reviewed by me and considered in my medical decision making (see chart for details).     Hx and exam c/w bilateral AOM with URI and pharyngitis Encouraged symptomatic treatment along with Augmentin F/u with PCP in 1 week if not improving.   Final Clinical Impressions(s) / UC Diagnoses   Final diagnoses:  Bilateral acute otitis media  Acute upper respiratory infection  Pharyngitis, unspecified etiology    New Prescriptions Discharge Medication List as of 10/27/2016 11:45 AM    START taking these medications   Details  amoxicillin-clavulanate (AUGMENTIN) 875-125 MG tablet Take 1 tablet by mouth  2 (two) times daily. One po bid x 7 days, Starting Sun 10/27/2016, Normal         Controlled Substance Prescriptions [place] Controlled Substance Registry consulted? Not Applicable   Rolla Plate 10/27/16 1218

## 2016-10-28 LAB — POCT RAPID STREP A (OFFICE): Rapid Strep A Screen: NEGATIVE

## 2016-10-30 ENCOUNTER — Ambulatory Visit (INDEPENDENT_AMBULATORY_CARE_PROVIDER_SITE_OTHER): Payer: 59 | Admitting: Physician Assistant

## 2016-10-30 ENCOUNTER — Encounter: Payer: Self-pay | Admitting: Physician Assistant

## 2016-10-30 VITALS — BP 127/81 | HR 83 | Ht 70.0 in | Wt 141.0 lb

## 2016-10-30 DIAGNOSIS — T887XXA Unspecified adverse effect of drug or medicament, initial encounter: Secondary | ICD-10-CM

## 2016-10-30 DIAGNOSIS — Z23 Encounter for immunization: Secondary | ICD-10-CM

## 2016-10-30 DIAGNOSIS — Z131 Encounter for screening for diabetes mellitus: Secondary | ICD-10-CM | POA: Diagnosis not present

## 2016-10-30 DIAGNOSIS — Z13220 Encounter for screening for lipoid disorders: Secondary | ICD-10-CM | POA: Diagnosis not present

## 2016-10-30 DIAGNOSIS — Z0000 Encounter for general adult medical examination without abnormal findings: Secondary | ICD-10-CM

## 2016-10-30 DIAGNOSIS — R002 Palpitations: Secondary | ICD-10-CM | POA: Diagnosis not present

## 2016-10-30 MED ORDER — NEBIVOLOL HCL 2.5 MG PO TABS: 2.5000 mg | ORAL_TABLET | Freq: Every day | ORAL | 1 refills | Status: DC

## 2016-10-30 NOTE — Patient Instructions (Addendum)
Replaced metoprolol with bystolic.   Keeping You Healthy  Get These Tests 1. Blood Pressure- Have your blood pressure checked once a year by your health care provider.  Normal blood pressure is 120/80. 2. Weight- Have your body mass index (BMI) calculated to screen for obesity.  BMI is measure of body fat based on height and weight.  You can also calculate your own BMI at [email or web address]://[email or web address]-[email or web address]/www.[email or web address]/bmi/. 3. Cholesterol- Have your cholesterol checked every 5 years starting at age 33 then yearly starting at age 33. 4. Chlamydia, HIV, and other sexually transmitted diseases- Get screened every year until age 33, then within three months of each new sexual provider. 5. Pap Test - Every 1-5 years; discuss with your health care provider. 6. Mammogram- Every 1-2 years starting at age 33--50  Take these medicines  Calcium with Vitamin D-Your body needs 1200 mg of Calcium each day and [phone] IU of Vitamin D daily.  Your body can only absorb 500 mg of Calcium at a time so Calcium must be taken in 2 or 3 divided doses throughout the day.  Multivitamin with folic acid- Once daily if it is possible for you to become pregnant.  Get these Immunizations  Gardasil-Series of three doses; prevents HPV related illness such as genital warts and cervical cancer.  Menactra-Single dose; prevents meningitis.  Tetanus shot- Every 10 years.  Flu shot-Every year.  Take these steps 1. Do not smoke-Your healthcare provider can help you quit.  For tips on how to quit go to www.smokefree.gov or call 1-800 QUITNOW. 2. Be physically active- Exercise 5 days a week for at least 30 minutes.  If you are not already physically active, start slow and gradually work up to 30 minutes of moderate physical activity.  Examples of moderate activity include walking briskly, dancing, swimming, bicycling, etc. 3. Breast Cancer- A self breast exam every month is important for early detection of breast cancer.  For more information and  instruction on self breast exams, ask your healthcare provider or [place].eswww.womenshealth.gov/faq/breast-self-exam.cfm. 4. Eat a healthy diet- Eat a variety of healthy foods such as fruits, vegetables, whole grains, low fat milk, low fat cheeses, yogurt, lean meats, poultry and fish, beans, nuts, tofu, etc.  For more information go to www. Thenutritionsource.org 5. Drink alcohol in moderation- Limit alcohol intake to one drink or less per day. Never drink and drive. 6. Depression- Your emotional health is as important as your physical health.  If you're feeling down or losing interest in things you normally enjoy please talk to your healthcare provider about being screened for depression. 7. Dental visit- Brush and floss your teeth twice daily; visit your dentist twice a year. 8. Eye doctor- Get an eye exam at least every 2 years. 9. Helmet use- Always wear a helmet when riding a bicycle, motorcycle, rollerblading or skateboarding. 10. Safe sex- If you may be exposed to sexually transmitted infections, use a condom. 11. Seat belts- Seat belts can save your live; always wear one. 12. Smoke/Carbon Monoxide detectors- These detectors need to be installed on the appropriate level of your home. Replace batteries at least once a year. 13. Skin cancer- When out in the sun please cover up and use sunscreen 15 SPF or higher. 14. Violence- If anyone is threatening or hurting you, please tell your healthcare provider.

## 2016-10-30 NOTE — Progress Notes (Signed)
Subjective:     Kelsey Dorsey is a 31 y.o. female and is here for a comprehensive physical exam. The patient reports problems - she remains fatigue on metoprolol. it seems to help her palpitations but she does not like the way it makes her feel. .she just has a feeling of tiredness or blunt feeling. Dr. Jens Somcrenshaw has suspected palpitations could be SVT that she is converting on her own. They have no proof of this through testing but BB has helped.   Social History   Social History  . Marital status: Single    Spouse name: N/A  . Number of children: N/A  . Years of education: N/A   Occupational History  .      Financial plannerervice manager for [employer]   Social History Main Topics  . Smoking status: Never Smoker  . Smokeless tobacco: Never Used  . Alcohol use Yes     Comment: Rare  . Drug use: No  . Sexual activity: Not Currently   Other Topics Concern  . Not on file   Social History Narrative  . No narrative on file   Health Maintenance  Topic Date Due  . HIV Screening  01/14/1998  . INFLUENZA VACCINE  08/07/2016  . PAP SMEAR  12/19/2017  . TETANUS/TDAP  11/09/2025    The following portions of the patient's history were reviewed and updated as appropriate: allergies, current medications, past family history, past medical history, past social history, past surgical history and problem list.  Review of Systems Pertinent items noted in HPI and remainder of comprehensive ROS otherwise negative.   Objective:    BP 127/81   Pulse 83   Ht 5\' 10"  (1.778 m)   Wt 141 lb (64 kg)   BMI 20.23 kg/m  General appearance: alert and cooperative Head: Normocephalic, without obvious abnormality, atraumatic Eyes: conjunctivae/corneas clear. PERRL, EOM's intact. Fundi benign. Ears: normal TM's and external ear canals both ears Nose: Nares normal. Septum midline. Mucosa normal. No drainage or sinus tenderness. Throat: lips, mucosa, and tongue normal; teeth and gums normal Neck: no adenopathy, no  carotid bruit, no JVD, supple, symmetrical, trachea midline and thyroid not enlarged, symmetric, no tenderness/mass/nodules Back: symmetric, no curvature. ROM normal. No CVA tenderness. Lungs: clear to auscultation bilaterally Heart: regular rate and rhythm, S1, S2 normal, no murmur, click, rub or gallop Abdomen: soft, non-tender; bowel sounds normal; no masses,  no organomegaly Extremities: extremities normal, atraumatic, no cyanosis or edema Pulses: 2+ and symmetric Skin: Skin color, texture, turgor normal. No rashes or lesions Lymph nodes: Cervical, supraclavicular, and axillary nodes normal. Neurologic: Alert and oriented X 3, normal strength and tone. Normal symmetric reflexes. Normal coordination and gait    Assessment:    Healthy female exam.      Plan:    [name].[name].Kelsey Dorsey was seen today for annual exam.  Diagnoses and all orders for this visit:  Routine physical examination -     Lipid Panel w/reflex Direct LDL -     COMPLETE METABOLIC PANEL WITH GFR  Screening for diabetes mellitus -     Lipid Panel w/reflex Direct LDL  Screening for lipid disorders -     COMPLETE METABOLIC PANEL WITH GFR  Medication side effect  Palpitations -     nebivolol (BYSTOLIC) 2.5 MG tablet; Take 1 tablet (2.5 mg total) by mouth daily.   .. [facility]30[facility]2016 07/18/2016  Decreased Interest 0 0  Down, Depressed, Hopeless 0 0  PHQ - 2 Score  0 0  Altered sleeping - 1  Tired, decreased energy - 0  Change in appetite - 0  Feeling bad or failure about yourself  - 0  Trouble concentrating - 0  Moving slowly or fidgety/restless - 0  Suicidal thoughts - 0  PHQ-9 Score - 1   Pt sees GYN for pap. Will send for results.   Screening labs ordered.   . Discussed 150 minutes of exercise a week.  Encouraged vitamin D 1000 units and Calcium 1300mg  or 4 servings of dairy a day.   Will stop metoprolol and start bystolic 2.5mg  daily. This could have less side effects for her. Follow up  in 1 month. Coupon card given. Call sooner if palpitations worsen and could increase dose sooner.  See After Visit Summary for Counseling Recommendations

## 2016-11-01 ENCOUNTER — Encounter: Payer: Self-pay | Admitting: Physician Assistant

## 2016-11-11 ENCOUNTER — Encounter: Payer: Self-pay | Admitting: Physician Assistant

## 2016-11-12 ENCOUNTER — Telehealth: Payer: Self-pay | Admitting: *Deleted

## 2016-11-12 NOTE — Telephone Encounter (Signed)
Prior auth form was faxed yesterday for [employer]

## 2016-12-19 ENCOUNTER — Ambulatory Visit (INDEPENDENT_AMBULATORY_CARE_PROVIDER_SITE_OTHER): Payer: 59 | Admitting: Osteopathic Medicine

## 2016-12-19 VITALS — BP 117/75 | HR 105 | Temp 98.3°F | Wt 139.1 lb

## 2016-12-19 DIAGNOSIS — R112 Nausea with vomiting, unspecified: Secondary | ICD-10-CM

## 2016-12-19 DIAGNOSIS — R002 Palpitations: Secondary | ICD-10-CM

## 2016-12-19 DIAGNOSIS — R1010 Upper abdominal pain, unspecified: Secondary | ICD-10-CM | POA: Diagnosis not present

## 2016-12-19 MED ORDER — METOPROLOL SUCCINATE ER 25 MG PO TB24: 25.0000 mg | ORAL_TABLET | Freq: Every day | ORAL | 3 refills | Status: DC

## 2016-12-19 MED ORDER — LOPERAMIDE HCL 2 MG PO CAPS: 2.0000 mg | ORAL_CAPSULE | Freq: Four times a day (QID) | ORAL | 1 refills | Status: DC | PRN

## 2016-12-19 MED ORDER — ONDANSETRON 8 MG PO TBDP
8.0000 mg | ORAL_TABLET | Freq: Three times a day (TID) | ORAL | 1 refills | Status: DC | PRN
Start: 2016-12-19 — End: 2017-08-06

## 2016-12-19 NOTE — Patient Instructions (Addendum)
At this point, I suspect viral GI illness or stomach/intestine irritation from something you ate. Treating the symptoms and staying well hydrated will be the best treatment - I sent medications for nausea and diarrhea, and we are getting labs as well.   Early in the course of abdominal discomfort or any other illness, it is easy to miss something since we don't have a strong pattern of symptoms to go on - if anything about your symptoms changes or gets worse, we may need to do additional testing. If you are feeling better within 5-7 days, nothing else to do.   Recommend follow up with Dr. Jens Somrenshaw for recheck palpitations. Please call his office to make an appointment. There was a minor change on your EKG which I don't believe is related to your symptoms and typically does not indicate anything dangerous - I'll forward a copy to him for second opinion as well.

## 2016-12-19 NOTE — Progress Notes (Signed)
HPI: Kelsey Dorsey is a 31 y.o. female who  has a past medical history of Palpitations.  she presents to [facility] today, 12/19/16,  for chief complaint of:  Palpitations - since resolved Upper abdominal pain w/ N/V   Rapid heart beat and upper abdominal pain x1 day, started last night. No fever. No chest pain.   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 Trial change Rx to Bystolic - prior auth sent 11/12/16, she is currently on Metoprolol since Bystolic wasn't approved. Felt strong/fast heart batins last night which has since resolves.   Epigastric abdominal cramping and dull pain since last night, vomited nonbloody/nonbilious few times, last 04:00 today, few loose stool, nonbloody.    Echo 05/01/16: mild mitral prolapse, otherwise essentially normal.   Seen here also a few times over the past year for palpitations and chest pain. Cardio notes reviewed from 04/17/16: discussed valsalva, likely SVT, Echo ordered, EKG ok.    Past medical, surgical, social and family history reviewed:  Patient Active Problem List   Diagnosis Date Noted  . Palpitations 04/15/2016  . Chest pain 04/15/2016  . Chest tightness 04/15/2016  . Sinus arrhythmia seen on electrocardiogram 11/10/2015  . Migraine 11/08/2014    Past Surgical History:  Procedure Laterality Date  . KNEE SURGERY      Social History   Tobacco Use  . Smoking status: Never Smoker  . Smokeless tobacco: Never Used  Substance Use Topics  . Alcohol use: Yes    Comment: Rare    Family History  Problem Relation Age of Onset  . Hyperlipidemia Mother   . Hypertension Mother   . Diabetes Mother   . Heart attack Mother   . Depression Father      Current medication list and allergy/intolerance information reviewed:    Current Outpatient Medications  Medication Sig Dispense Refill  .  CRYSELLE-28 0.3-30 MG-MCG tablet Take 1 tablet by mouth daily. 3 Package 4  . metoprolol succinate (TOPROL-XL) 25 MG 24 hr tablet Take 1 tablet (25 mg total) by mouth daily. 90 tablet 3  . amoxicillin-clavulanate (AUGMENTIN) 875-125 MG tablet Take 1 tablet by mouth 2 (two) times daily. One po bid x 7 days (Patient not taking: Reported on 12/19/2016) 14 tablet 0  . nebivolol (BYSTOLIC) 2.5 MG tablet Take 1 tablet (2.5 mg total) by mouth daily. (Patient not taking: Reported on 12/19/2016) 30 tablet 1   No current facility-administered medications for this visit.     Allergies  Allergen Reactions  . Codeine Itching  . Cephalexin Other (See Comments)  . Morphine And Related Other (See Comments)      Review of Systems:  Constitutional:  No  fever, no chills, No recent illness, No unintentional weight changes. No significant fatigue.   HEENT: No  headache, no vision change, no hearing change, No sore throat, No  sinus pressure  Cardiac: No  chest pain, No  pressure, +palpitations, No  Orthopnea  Respiratory:  No  shortness of breath. No  Cough  Gastrointestinal: +abdominal pain, +nausea, +vomiting,  No  blood in stool, No  diarrhea, No  constipation   Musculoskeletal: No new myalgia/arthralgia  Skin: No  Rash  Neurologic: No  weakness, No  dizziness   Exam:  BP 117/75   Pulse (!) 105   Temp 98.3 F (36.8 C) (Oral)   Wt 139 lb 1.3 oz (63.1  kg)   BMI 19.96 kg/m   Constitutional: VS see above. General Appearance: alert, well-developed, well-nourished, NAD  Eyes: Normal lids and conjunctive, non-icteric sclera  Neck: No masses, trachea midline. No thyroid enlargement.   Respiratory: Normal respiratory effort. no wheeze, no rhonchi, no rales  Cardiovascular: S1/S2 normal, no murmur, no rub/gallop auscultated. RRR. No lower extremity edema.   Gastrointestinal: Nontender, no masses. No hepatomegaly, no splenomegaly. No hernia appreciated. Bowel sounds normal. Rectal exam  deferred.   Musculoskeletal: Gait normal. Symmetric and independent movement al extremities.   Neurological: Normal balance/coordination. No tremor.   Skin: warm, dry, intact. No rash/ulcer.    Psychiatric: Normal judgment/insight. Normal mood and affect. Oriented x3.    EKG interpretation: Rate: 89 Rhythm: sinus New RSR' in V3 No ST/T changes concerning for acute ischemia/infarct  Previous EKG 07/18/16 and 04/17/16: sinus, low voltage ,    ASSESSMENT/PLAN:   Palpitations - new RSR' onEKG unlikely any significance but will route to Dr Jens Somrenshaw as Lorain ChildesFYI, pt advised to follow up with him. I put metoprolol back on her list.  - Plan: CBC, COMPLETE METABOLIC PANEL WITH GFR, Lipase, Magnesium, EKG 12-Lead  Upper abdominal pain - suspect gastritis, possibly pancreatitis, limited duration of symptoms - await labs, treat sytmpoms, consider furhter w/u depending on progress  - Plan: CBC, COMPLETE METABOLIC PANEL WITH GFR, Lipase, Magnesium  Non-intractable vomiting with nausea, unspecified vomiting type - likely gastroenteritis    Patient Instructions  At this point, I suspect viral GI illness or stomach/intestine irritation from something you ate. Treating the symptoms and staying well hydrated will be the best treatment - I sent medications for nausea and diarrhea, and we are getting labs as well.   Early in the course of abdominal discomfort or any other illness, it is easy to miss something since we don't have a strong pattern of symptoms to go on - if anything about your symptoms changes or gets worse, we may need to do additional testing. If you are feeling better within 5-7 days, nothing else to do.   Recommend follow up with Dr. Jens Somrenshaw for recheck palpitations. Please call his office to make an appointment. There was a minor change on your EKG which I don't believe is related to your symptoms and typically does not indicate anything dangerous - I'll forward a copy to him for second  opinion as well.    Meds ordered this encounter  Medications  . metoprolol succinate (TOPROL-XL) 25 MG 24 hr tablet    Sig: Take 1 tablet (25 mg total) by mouth daily.    Dispense:  90 tablet    Refill:  3  . ondansetron (ZOFRAN-ODT) 8 MG disintegrating tablet    Sig: Take 1 tablet (8 mg total) by mouth every 8 (eight) hours as needed for nausea.    Dispense:  20 tablet    Refill:  1  . loperamide (IMODIUM A-D) 2 MG capsule    Sig: Take 1-2 capsules (2-4 mg total) by mouth 4 (four) times daily as needed for diarrhea or loose stools.    Dispense:  30 capsule    Refill:  1      Visit summary with medication list and pertinent instructions was printed for patient to review. All questions at time of visit were answered - patient instructed to contact office with any additional concerns. ER/RTC precautions were reviewed with the patient.   Follow-up plan: Return if symptoms worsen or fail to improve.  Note: Total time spent 25  minutes, greater than 50% of the visit was spent face-to-face counseling and coordinating care for the following: The primary encounter diagnosis was Palpitations. Diagnoses of Upper abdominal pain and Non-intractable vomiting with nausea, unspecified vomiting type were also pertinent to this visit.[name].  Please note: voice recognition software was used to produce this document, and typos may escape review. Please contact Dr. Lyn HollingsheadAlexander for any needed clarifications.

## 2016-12-20 ENCOUNTER — Other Ambulatory Visit: Payer: Self-pay | Admitting: Osteopathic Medicine

## 2016-12-20 DIAGNOSIS — R002 Palpitations: Secondary | ICD-10-CM

## 2016-12-20 LAB — COMPLETE METABOLIC PANEL WITH GFR
AG Ratio: 1.5 (calc) (ref 1.0–2.5)
ALT: 18 U/L (ref 6–29)
AST: 16 U/L (ref 10–30)
Albumin: 4.4 g/dL (ref 3.6–5.1)
Alkaline phosphatase (APISO): 55 U/L (ref 33–115)
BUN: 10 mg/dL (ref 7–25)
CO2: 25 mmol/L (ref 20–32)
Calcium: 9.5 mg/dL (ref 8.6–10.2)
Chloride: 100 mmol/L (ref 98–110)
Creat: 0.66 mg/dL (ref 0.50–1.10)
GFR, Est African American: 135 mL/min/{1.73_m2} (ref >=60)
GFR, Est Non African American: 116 mL/min/{1.73_m2} (ref >=60)
Globulin: 3 g/dL (calc) (ref 1.9–3.7)
Glucose, Bld: 90 mg/dL (ref 65–99)
Potassium: 4.3 mmol/L (ref 3.5–5.3)
Sodium: 136 mmol/L (ref 135–146)
Total Bilirubin: 1.4 mg/dL — ABNORMAL HIGH (ref 0.2–1.2)
Total Protein: 7.4 g/dL (ref 6.1–8.1)

## 2016-12-20 LAB — MAGNESIUM: Magnesium: 1.7 mg/dL (ref 1.5–2.5)

## 2016-12-20 LAB — CBC
HCT: 40.3 % (ref 35.0–45.0)
HEMOGLOBIN: 13.6 g/dL (ref 11.7–15.5)
MCH: 30.5 pg (ref 27.0–33.0)
MCHC: 33.7 g/dL (ref 32.0–36.0)
MCV: 90.4 fL (ref 80.0–100.0)
MPV: 9.9 fL (ref 7.5–12.5)
PLATELETS: 247 10*3/uL (ref 140–400)
RBC: 4.46 10*6/uL (ref 3.80–5.10)
RDW: 11.9 % (ref 11.0–15.0)
WBC: 6 10*3/uL (ref 3.8–10.8)

## 2016-12-20 LAB — LIPID PANEL W/REFLEX DIRECT LDL
Cholesterol: 151 mg/dL (ref <200)
HDL: 91 mg/dL (ref >50)
LDL Cholesterol (Calc): 46 mg/dL (calc)
Non-HDL Cholesterol (Calc): 60 mg/dL (calc) (ref <130)
Total CHOL/HDL Ratio: 1.7 (calc) (ref <5.0)
Triglycerides: 68 mg/dL (ref <150)

## 2016-12-20 LAB — TEST AUTHORIZATION

## 2016-12-20 LAB — TSH: TSH: 1.25 m[IU]/L

## 2016-12-20 LAB — LIPASE: Lipase: 9 U/L (ref 7–60)

## 2016-12-20 NOTE — Progress Notes (Signed)
tsh order

## 2017-01-04 ENCOUNTER — Encounter: Payer: Self-pay | Admitting: Physician Assistant

## 2017-01-08 ENCOUNTER — Other Ambulatory Visit (HOSPITAL_COMMUNITY)
Admission: RE | Admit: 2017-01-08 | Discharge: 2017-01-08 | Disposition: A | Discharge status: Home / Self Care | Payer: 59 | Source: Ambulatory Visit | Attending: Physician Assistant | Admitting: Physician Assistant

## 2017-01-08 ENCOUNTER — Ambulatory Visit (INDEPENDENT_AMBULATORY_CARE_PROVIDER_SITE_OTHER): Payer: 59 | Admitting: Physician Assistant

## 2017-01-08 ENCOUNTER — Encounter: Payer: Self-pay | Admitting: Physician Assistant

## 2017-01-08 VITALS — BP 133/84 | HR 73 | Ht 70.0 in | Wt 137.0 lb

## 2017-01-08 DIAGNOSIS — Z01419 Encounter for gynecological examination (general) (routine) without abnormal findings: Secondary | ICD-10-CM | POA: Diagnosis present

## 2017-01-08 DIAGNOSIS — R002 Palpitations: Secondary | ICD-10-CM

## 2017-01-08 DIAGNOSIS — Z3009 Encounter for other general counseling and advice on contraception: Secondary | ICD-10-CM | POA: Diagnosis not present

## 2017-01-08 LAB — POCT URINE PREGNANCY: PREG TEST UR: NEGATIVE

## 2017-01-08 NOTE — Progress Notes (Signed)
   Subjective:    Patient ID: Kelsey Dorsey, female    DOB: 03/10/1985, 32 y.o.   MRN: [id number]  HPI  Pt is a pleasant 32 yo female who presents to the clinic for routine pap smear. She had CPE in Fall 2018. She is not having any concerns but would like to change birth control. She is on the pills right now and hard to be consistent with taking them due to her erratic schedule. She would like IUD.   Palpitations with questions of SVT and self converting- much better on metoprolol but her energy is so decreased. She does not like the way she feels. I tried sending over bystolic but was not approved. She has cardiologist but has not seen him in month and since she start metoprolol in July.   .. Active Ambulatory Problems    Diagnosis Date Noted  . Sinus arrhythmia seen on electrocardiogram 11/10/2015  . Palpitations 04/15/2016  . Chest pain 04/15/2016  . Chest tightness 04/15/2016  . Migraine 11/08/2014   Resolved Ambulatory Problems    Diagnosis Date Noted  . No Resolved Ambulatory Problems   Past Medical History:  Diagnosis Date  . Palpitations     Review of Systems  All other systems reviewed and are negative.      Objective:   Physical Exam  Constitutional: She appears well-developed and well-nourished.  Cardiovascular: Normal rate, regular rhythm and normal heart sounds.  Genitourinary: Vagina normal and uterus normal. No vaginal discharge found.  Genitourinary Comments: No ulcers/polyps cervical os.  No adenxal tenderness/masses palpated.   Psychiatric: She has a normal mood and affect. Her behavior is normal.          Assessment & Plan:  [name].[name].Kelsey Dorsey was seen today for gynecologic exam.  Diagnoses and all orders for this visit:  Encounter for gynecological examination without abnormal finding -     Cytology - PAP  Birth control counseling -     POCT urine pregnancy -     C. trachomatis/N. gonorrhoeae RNA  Palpitations -     Ambulatory referral to  Cardiology   Discussed risk and benefits of IUD she would like to proceed.  GC/Chlamydia and UPT ordered today.  Scheduled for next week with Kelsey Dorsey. She is deciding between kyleena/mirena.   I would like cardiology to reevaluate perhaps CCB instead of BB could be tried to keep symptoms under control but not cause the fatigue of BB. New referral placed.

## 2017-01-08 NOTE — Patient Instructions (Addendum)
Make appt in next 2-3 days for IUD insertion.   Levonorgestrel intrauterine device (IUD) What is this medicine? LEVONORGESTREL IUD (LEE voe nor jes trel) is a contraceptive (birth control) device. The device is placed inside the uterus by a healthcare professional. It is used to prevent pregnancy. This device can also be used to treat heavy bleeding that occurs during your period. This medicine may be used for other purposes; ask your health care provider or pharmacist if you have questions. COMMON BRAND NAME(S): Cameron AliKyleena, LILETTA, Mirena, Skyla What should I tell my health care provider before I take this medicine? They need to know if you have any of these conditions: -abnormal Pap smear -cancer of the breast, uterus, or cervix -diabetes -endometritis -genital or pelvic infection now or in the past -have more than one sexual partner or your partner has more than one partner -heart disease -history of an ectopic or tubal pregnancy -immune system problems -IUD in place -liver disease or tumor -problems with blood clots or take blood-thinners -seizures -use intravenous drugs -uterus of unusual shape -vaginal bleeding that has not been explained -an unusual or allergic reaction to levonorgestrel, other hormones, silicone, or polyethylene, medicines, foods, dyes, or preservatives -pregnant or trying to get pregnant -breast-feeding How should I use this medicine? This device is placed inside the uterus by a health care professional. Talk to your pediatrician regarding the use of this medicine in children. Special care may be needed. Overdosage: If you think you have taken too much of this medicine contact a poison control center or emergency room at once. NOTE: This medicine is only for you. Do not share this medicine with others. What if I miss a dose? This does not apply. Depending on the brand of device you have inserted, the device will need to be replaced every 3 to 5 years if you  wish to continue using this type of birth control. What may interact with this medicine? Do not take this medicine with any of the following medications: -amprenavir -bosentan -fosamprenavir This medicine may also interact with the following medications: -aprepitant -armodafinil -barbiturate medicines for inducing sleep or treating seizures -bexarotene -boceprevir -griseofulvin -medicines to treat seizures like carbamazepine, ethotoin, felbamate, oxcarbazepine, phenytoin, topiramate -modafinil -pioglitazone -rifabutin -rifampin -rifapentine -some medicines to treat HIV infection like atazanavir, efavirenz, indinavir, lopinavir, nelfinavir, tipranavir, ritonavir -St. John's wort -warfarin This list may not describe all possible interactions. Give your health care provider a list of all the medicines, herbs, non-prescription drugs, or dietary supplements you use. Also tell them if you smoke, drink alcohol, or use illegal drugs. Some items may interact with your medicine. What should I watch for while using this medicine? Visit your doctor or health care professional for regular check ups. See your doctor if you or your partner has sexual contact with others, becomes HIV positive, or gets a sexual transmitted disease. This product does not protect you against HIV infection (AIDS) or other sexually transmitted diseases. You can check the placement of the IUD yourself by reaching up to the top of your vagina with clean fingers to feel the threads. Do not pull on the threads. It is a good habit to check placement after each menstrual period. Call your doctor right away if you feel more of the IUD than just the threads or if you cannot feel the threads at all. The IUD may come out by itself. You may become pregnant if the device comes out. If you notice that the IUD has come out  use a backup birth control method like condoms and call your health care provider. Using tampons will not change the  position of the IUD and are okay to use during your period. This IUD can be safely scanned with magnetic resonance imaging (MRI) only under specific conditions. Before you have an MRI, tell your healthcare provider that you have an IUD in place, and which type of IUD you have in place. What side effects may I notice from receiving this medicine? Side effects that you should report to your doctor or health care professional as soon as possible: -allergic reactions like skin rash, itching or hives, swelling of the face, lips, or tongue -fever, flu-like symptoms -genital sores -high blood pressure -no menstrual period for 6 weeks during use -pain, swelling, warmth in the leg -pelvic pain or tenderness -severe or sudden headache -signs of pregnancy -stomach cramping -sudden shortness of breath -trouble with balance, talking, or walking -unusual vaginal bleeding, discharge -yellowing of the eyes or skin Side effects that usually do not require medical attention (report to your doctor or health care professional if they continue or are bothersome): -acne -breast pain -change in sex drive or performance -changes in weight -cramping, dizziness, or faintness while the device is being inserted -headache -irregular menstrual bleeding within first 3 to 6 months of use -nausea This list may not describe all possible side effects. Call your doctor for medical advice about side effects. You may report side effects to FDA at 1-800-FDA-1088. Where should I keep my medicine? This does not apply. NOTE: This sheet is a summary. It may not cover all possible information. If you have questions about this medicine, talk to your doctor, pharmacist, or health care provider.  2018 Elsevier/Gold Standard (2015-10-06 14:14:56)  

## 2017-01-09 LAB — C. TRACHOMATIS/N. GONORRHOEAE RNA
C. TRACHOMATIS RNA, TMA: NOT DETECTED
N. GONORRHOEAE RNA, TMA: NOT DETECTED

## 2017-01-13 ENCOUNTER — Encounter: Payer: Self-pay | Admitting: Physician Assistant

## 2017-01-14 LAB — CYTOLOGY - PAP
Diagnosis: NEGATIVE
HPV (WINDOPATH): NOT DETECTED

## 2017-01-14 NOTE — Progress Notes (Signed)
Call pt: normal cytology. Negative HPV. 3 years next pap.

## 2017-01-20 ENCOUNTER — Encounter: Payer: Self-pay | Admitting: Osteopathic Medicine

## 2017-01-20 ENCOUNTER — Ambulatory Visit (INDEPENDENT_AMBULATORY_CARE_PROVIDER_SITE_OTHER): Payer: 59 | Admitting: Osteopathic Medicine

## 2017-01-20 VITALS — BP 114/77 | HR 86 | Temp 97.6°F | Wt 140.0 lb

## 2017-01-20 DIAGNOSIS — Z30430 Encounter for insertion of intrauterine contraceptive device: Secondary | ICD-10-CM | POA: Diagnosis not present

## 2017-01-20 DIAGNOSIS — Z975 Presence of (intrauterine) contraceptive device: Secondary | ICD-10-CM

## 2017-01-20 LAB — POCT URINE PREGNANCY: Preg Test, Ur: NEGATIVE

## 2017-01-20 MED ORDER — LEVONORGESTREL 19.5 MG IU IUD: 1.0000 | INTRAUTERINE_SYSTEM | Freq: Once | INTRAUTERINE | 0 refills | Status: DC

## 2017-01-20 NOTE — Patient Instructions (Signed)
IUD AFTER-CARE INSTRUCTIONS: READ THOROUGHLY  Your Rutha BouchardKyleena  IUD is currently approved to remain in place for 5 years. At that time, if you wish to receive a new IUD, this can be placed when your current one is removed. If you wish to remove your IUD at any time, for any reason, this can be done earlier by your doctor.   You should feel for the strings to your IUD routinely. If you cannot locate the strings, it is recommended you alert your doctor of this.   Be aware that in the first few weeks, your new IUD may cause some discomfort/cramping as it settles into place in your uterus. To ease discomfort, you may apply heating pad to abdomen and take Ibuprofen 800mg  by mouth every 6 hours as needed, but avoid using this dose continuously for more than 5 days. Walking helps as well. You can also expect some irregular bleeding but it should not be very heavy for more than a few days. Do not use tampons for at least one week. Nothing in the vagina for at least one week.   If pain is severe or if severe bleeding occurs, or if foul-smelling discharge or fever develops, or if you have any other concerns - contact your doctor right away or go to the Emergency Room.  IUD's are a very reliable method of birth control, but no method is 100% effective. If you think you may be pregnant, see your doctor right away.   An IUD will not protect you from sexually transmitted infections such as HIV, gonorrhea, chlamydia, HPV and others.   It is recommended that you see your doctor as directed for routine well-woman care, which includes Pap testing and may include screening for infections.

## 2017-01-20 NOTE — Progress Notes (Signed)
HPI: Kelsey Dorsey is a 32 y.o. female who presents to [facility] today 01/20/17 for chief complaint of:  Chief Complaint  Patient presents with  . Contraception   G0 patient is seen at the request of Kelsey Dorsey for consultation regarding IUD birth control. Has never had IUD in the past but is interesting and long-acting reversible contraception. Has been debating back-and-forth between the [place]rena and Malaysiathe Kyleena.      Past medical, social and family history reviewed: Patient Active Problem List   Diagnosis Date Noted  . Palpitations 04/15/2016  . Chest pain 04/15/2016  . Chest tightness 04/15/2016  . Sinus arrhythmia seen on electrocardiogram 11/10/2015  . Migraine 11/08/2014   Past Surgical History:  Procedure Laterality Date  . KNEE SURGERY     Social History   Socioeconomic History  . Marital status: Single    Spouse name: Not on file  . Number of children: Not on file  . Years of education: Not on file  . Highest education level: Not on file  Social Needs  . Financial resource strain: Not on file  . Food insecurity - worry: Not on file  . Food insecurity - inability: Not on file  . Transportation needs - medical: Not on file  . Transportation needs - non-medical: Not on file  Occupational History    Comment: Financial plannerervice manager for [employer]  Tobacco Use  . Smoking status: Never Smoker  . Smokeless tobacco: Never Used  Substance and Sexual Activity  . Alcohol use: Yes    Comment: Rare  . Drug use: No  . Sexual activity: Not Currently  Other Topics Concern  . Not on file  Social History Narrative  . Not on file   Family History  Problem Relation Age of Onset  . Hyperlipidemia Mother   . Hypertension Mother   . Diabetes Mother   . Heart attack Mother   . Depression Father     Current Outpatient Medications (Endocrine & Metabolic):  .  CRYSELLE-28 0.3-30 MG-MCG tablet, Take 1 tablet by mouth daily.  Current  Outpatient Medications (Cardiovascular):  .  metoprolol succinate (TOPROL-XL) 25 MG 24 hr tablet, Take 1 tablet (25 mg total) by mouth daily.     Current Outpatient Medications (Other):  .  loperamide (IMODIUM A-D) 2 MG capsule, Take 1-2 capsules (2-4 mg total) by mouth 4 (four) times daily as needed for diarrhea or loose stools. .  ondansetron (ZOFRAN-ODT) 8 MG disintegrating tablet, Take 1 tablet (8 mg total) by mouth every 8 (eight) hours as needed for nausea. Allergies  Allergen Reactions  . Codeine Itching  . Cephalexin Other (See Comments)  . Morphine And Related Other (See Comments)      Review of Systems: CONSTITUTIONAL:  No  fever, no chills HEAD/EYES/EARS/NOSE/THROAT: No  headache, no vision change CARDIAC: No  chest pain,  GASTROINTESTINAL: No  nausea, No  vomiting, No  abdominal pain GENITOURINARY: No  incontinence, No  abnormal genital bleeding/discharge SKIN: No  rash/wounds/concerning lesions   Exam:  BP 114/77   Pulse 86   Temp 97.6 F (36.4 C) (Oral)   Wt 140 lb (63.5 kg)   BMI 20.09 kg/m  Constitutional: VS see above. General Appearance: alert, well-developed, well-nourished, NAD Psychiatric: Normal judgment/insight. Normal mood and affect. Oriented x3.  GYN: see procedure note below  No results found for this or any previous visit (from the past 72 hour(s)).   Results for orders placed or performed  in visit on 01/20/17 (from the past 24 hour(s))  POCT urine pregnancy     Status: None   Collection Time: 01/20/17 10:23 AM  Result Value Ref Range   Preg Test, Ur Negative Negative      ASSESSMENT/PLAN:   Patient was counseled on the risks versus benefits of IUD contraception, including excellent contraceptive efficacy but risk of uterine perforation, small chance of ascending infection, irregular bleeding, and others. In light of her preferences, previous contraception experience, other risk factors as noted in history of present illness. Patient  opts to proceed with insertion of Kyleena IUD.   Patient was educated on the process for insertion, what to expect from vaginal exam and insertion process, reasons that we would abort the procedure such as abnormal anatomy, non-passage of the uterine sound, patient inability to tolerate the procedure, or other.  See below for procedure note  Encounter for IUD insertion - Plan: Levonorgestrel (KYLEENA) 19.5 MG IUD  Contraception, device intrauterine - Plan: POCT urine pregnancy   IUD PROCEDURE NOTE  PERTINENT RESULTS REVIEWED: PREGNANCY TEST PRIOR TO PROCEDURE: Negative GONORRHEA/CHLAMYDIA SCREEN: Negative  PRIOR TO PROCEDURE: INFORMED CONSENT OBTAINED: yes SEE SCANNED DOCUMENTS ANY PRETREATMENT: none  PHYSICAL EXAM: GYN: No lesions/ulcers to external genitalia, normal urethra, normal vaginal mucosa, physiologic discharge, cervix normal without lesions, uterus not enlarged or tender, adnexa no masses and nontender  DESCRIPTION OF PROCEDURE: Vaginal speculum placed. Cervix and proximal vagina cleaned with Betadine.Tenaculum applied at 12:00 cervical position and gentle traction applied. Uterus sounded to 8 cm. IUD placed without difficulty. IUD threads cut to 2-3cm from cervical os. Tenaculum and speculum removed. Patient felt strings. Patient tolerated procedure well. Sterile technique maintained.   IUD INFORMATION: BRAND: Kyleena  CARD GIVEN TO PATIENT: yes   Patient Instructions  IUD AFTER-CARE INSTRUCTIONS: READ THOROUGHLY  Your Kelsey Dorsey  IUD is currently approved to remain in place for 5 years. At that time, if you wish to receive a new IUD, this can be placed when your current one is removed. If you wish to remove your IUD at any time, for any reason, this can be done earlier by your doctor.   You should feel for the strings to your IUD routinely. If you cannot locate the strings, it is recommended you alert your doctor of this.   Be aware that in the first few weeks, your  new IUD may cause some discomfort/cramping as it settles into place in your uterus. To ease discomfort, you may apply heating pad to abdomen and take Ibuprofen 800mg  by mouth every 6 hours as needed, but avoid using this dose continuously for more than 5 days. Walking helps as well. You can also expect some irregular bleeding but it should not be very heavy for more than a few days. Do not use tampons for at least one week. Nothing in the vagina for at least one week.   If pain is severe or if severe bleeding occurs, or if foul-smelling discharge or fever develops, or if you have any other concerns - contact your doctor right away or go to the Emergency Room.  IUD's are a very reliable method of birth control, but no method is 100% effective. If you think you may be pregnant, see your doctor right away.   An IUD will not protect you from sexually transmitted infections such as HIV, gonorrhea, chlamydia, HPV and others.   It is recommended that you see your doctor as directed for routine well-woman care, which includes Pap testing  and may include screening for infections.      All questions were answered. Visit summary with updated medication list and pertinent instructions was printed for patient. ER/RTC precautions were reviewed with the patient. Return for IUD string check 4 wk as directed.   Total time spent 25 minutes, greater than 50% of the visit was counseling and coordinating care for diagnosis of The primary encounter diagnosis was Encounter for IUD insertion. A diagnosis of Contraception, device intrauterine was also pertinent to this visit.

## 2017-02-17 ENCOUNTER — Encounter: Payer: Self-pay | Admitting: Osteopathic Medicine

## 2017-02-17 ENCOUNTER — Ambulatory Visit (INDEPENDENT_AMBULATORY_CARE_PROVIDER_SITE_OTHER): Payer: 59 | Admitting: Osteopathic Medicine

## 2017-02-17 VITALS — BP 115/75 | HR 74 | Temp 98.1°F | Wt 139.1 lb

## 2017-02-17 DIAGNOSIS — Z975 Presence of (intrauterine) contraceptive device: Secondary | ICD-10-CM

## 2017-02-17 DIAGNOSIS — Z30431 Encounter for routine checking of intrauterine contraceptive device: Secondary | ICD-10-CM

## 2017-02-17 NOTE — Patient Instructions (Signed)
   IUD appears to be in place - still recommend checking for strings routinely, come see me if any concerns   Bleeding can be irregular for the first few months, most women w/ Kelsey BouchardKyleena will have lighter periods or no periods - this is normal  If bleeding/cramping, take Ibuprofen 800 mg 3-4 times per day for 4-5 days

## 2017-02-17 NOTE — Progress Notes (Signed)
HPI: Kelsey Dorsey is a 32 y.o. female who  has a past medical history of Palpitations.  she presents to [facility] today, 02/17/17,  for chief complaint of: IUD string check  Spotting/cramping since the insertion, heavier few days ago like normal cycle but shorter duration. No abdominal pain or fever.    Past medical, surgical, social and family history reviewed.  Current medication list and allergy/intolerance information reviewed:    Current Outpatient Medications  Medication Sig Dispense Refill  . Levonorgestrel (KYLEENA) 19.5 MG IUD 1 Device by Intrauterine route once for 1 dose. Inserted 01/20/2017 by Dr. Lyn Hollingshead. 1 Intra Uterine Device 0  . loperamide (IMODIUM A-D) 2 MG capsule Take 1-2 capsules (2-4 mg total) by mouth 4 (four) times daily as needed for diarrhea or loose stools. 30 capsule 1  . metoprolol succinate (TOPROL-XL) 25 MG 24 hr tablet Take 1 tablet (25 mg total) by mouth daily. 90 tablet 3  . ondansetron (ZOFRAN-ODT) 8 MG disintegrating tablet Take 1 tablet (8 mg total) by mouth every 8 (eight) hours as needed for nausea. 20 tablet 1   No current facility-administered medications for this visit.     Allergies  Allergen Reactions  . Codeine Itching  . Cephalexin Other (See Comments)  . Morphine And Related Other (See Comments)      Review of Systems:  Constitutional:  No  fever, no chills  Cardiac: No  chest pain  Respiratory:  No  shortness of breath  Gastrointestinal: No  abdominal pain, No  nausea, No  vomiting  Musculoskeletal: No new myalgia/arthralgia  Skin: No  Rash, No other wounds/concerning lesions  Genitourinary: No  incontinence, +abnormal genital bleeding, No abnormal genital discharge   Exam:  BP 115/75   Pulse 74   Temp 98.1 F (36.7 C) (Oral)   Wt 139 lb 1.9 oz (63.1 kg)   LMP  (LMP Unknown)   BMI 19.96 kg/m   Constitutional: VS see above. General Appearance: alert, well-developed,  well-nourished, NAD  Neck: No masses, trachea midline.  Respiratory: Normal respiratory effort  Gastrointestinal: Nontender, no masses.  Musculoskeletal: Gait normal.   Neurological: Normal balance/coordination. No tremor.  Skin: warm, dry, intact. No rash/ulcer. No concerning nevi or subq nodules on limited exam.    Psychiatric: Normal judgment/insight. Normal mood and affect. Oriented x3.  GYN: No lesions/ulcers to external genitalia, normal urethra, normal vaginal mucosa, physiologic discharge, cervix normal without lesions, uterus not enlarged or tender, adnexa no masses and non-tender. IUD strings visible about 2-3 cm outside cervical os, no bleeding present.      ASSESSMENT/PLAN:   Contraception, device intrauterine  IUD check up    Patient Instructions   IUD appears to be in place - still recommend checking for strings routinely, come see me if any concerns   Bleeding can be irregular for the first few months, most women w/ [name] will have lighter periods or no periods - this is normal  If bleeding/cramping, take Ibuprofen 800 mg 3-4 times per day for 4-5 days     Visit summary with medication list and pertinent instructions was printed for patient to review. All questions at time of visit were answered - patient instructed to contact office with any additional concerns. ER/RTC precautions were reviewed with the patient.   Follow-up plan: Return for recheck IUD as needed.  Note: Total time spent 10 minutes, greater than 50% of the visit was spent face-to-face counseling and coordinating care for the following: The  primary encounter diagnosis was Contraception, device intrauterine. A diagnosis of IUD check up was also pertinent to this visit.[name].  Please note: voice recognition software was used to produce this document, and typos may escape review. Please contact Dr. Lyn HollingsheadAlexander for any needed clarifications.

## 2017-03-14 ENCOUNTER — Telehealth: Payer: Self-pay | Admitting: Physician Assistant

## 2017-03-14 MED ORDER — DICLOFENAC POTASSIUM(MIGRAINE) 50 MG PO PACK: 50.0000 mg | PACK | Freq: Two times a day (BID) | ORAL | 2 refills | Status: DC | PRN

## 2017-03-14 NOTE — Telephone Encounter (Signed)
Pt called and needed prn cambia(diclofenac) powder packets for migraine rescue. Sent to pharmacy. Follow up if having multiple migraines a week.

## 2017-03-29 ENCOUNTER — Other Ambulatory Visit: Payer: Self-pay | Admitting: Physician Assistant

## 2017-07-22 ENCOUNTER — Encounter: Payer: Self-pay | Admitting: Physician Assistant

## 2017-07-22 NOTE — Telephone Encounter (Signed)
Can you call and see if we can schedule for a 40 minute appt?

## 2017-07-23 NOTE — Telephone Encounter (Signed)
Printed for scheduler

## 2017-07-23 NOTE — Telephone Encounter (Signed)
I left a message for pt to call the office to her [facility]

## 2017-08-06 ENCOUNTER — Encounter: Payer: Self-pay | Admitting: Physician Assistant

## 2017-08-06 ENCOUNTER — Ambulatory Visit (INDEPENDENT_AMBULATORY_CARE_PROVIDER_SITE_OTHER): Payer: 59 | Admitting: Physician Assistant

## 2017-08-06 VITALS — BP 125/85 | HR 69 | Ht 70.0 in | Wt 135.0 lb

## 2017-08-06 DIAGNOSIS — G479 Sleep disorder, unspecified: Secondary | ICD-10-CM | POA: Diagnosis not present

## 2017-08-06 DIAGNOSIS — F341 Dysthymic disorder: Secondary | ICD-10-CM

## 2017-08-06 DIAGNOSIS — F419 Anxiety disorder, unspecified: Secondary | ICD-10-CM

## 2017-08-06 MED ORDER — ESCITALOPRAM OXALATE 10 MG PO TABS: 10.0000 mg | ORAL_TABLET | Freq: Every day | ORAL | 1 refills | Status: DC

## 2017-08-06 MED ORDER — LORAZEPAM 0.5 MG PO TABS: 0.5000 mg | ORAL_TABLET | Freq: Two times a day (BID) | ORAL | 0 refills | Status: DC | PRN

## 2017-08-06 NOTE — Progress Notes (Signed)
Subjective:    Patient ID: Kelsey Dorsey, female    DOB: 1985-02-05, 32 y.o.   MRN: [id number]  HPI  Pt is a 32 yo female who presents to the clinic with sudden worsening of mood about 2 months ago. She has never had a problem with anxiety or depression but her father, mother and sisters have. Her dad committed suicide when she was 71 years old. She has no trigger for feeling this way. She is happy and in a good relationship. Just switched jobs to a less stressful job. No reason to feel this way. No changes in medication except IUD in January. She has tried [employer]. [employer], [employer] solutions, she is exercising regularly. She is having a lot of time focusing. She is waking up a lot during the night. She feels like not getting good sleep.    .. Active Ambulatory Problems    Diagnosis Date Noted  . Sinus arrhythmia seen on electrocardiogram 11/10/2015  . Palpitations 04/15/2016  . Chest pain 04/15/2016  . Chest tightness 04/15/2016  . Migraine 11/08/2014  . Dysthymia 08/06/2017  . Anxiety 08/06/2017   Resolved Ambulatory Problems    Diagnosis Date Noted  . No Resolved Ambulatory Problems   Past Medical History:  Diagnosis Date  . Palpitations      Review of Systems  All other systems reviewed and are negative.      Objective:   Physical Exam  Constitutional: She is oriented to person, place, and time. She appears well-developed and well-nourished.  HENT:  Head: Normocephalic and atraumatic.  Neck: Normal range of motion. Neck supple.  Cardiovascular: Normal rate and regular rhythm.  Pulmonary/Chest: Effort normal and breath sounds normal.  Neurological: She is alert and oriented to person, place, and time.  Psychiatric: Her behavior is normal.  Very tearful.           Assessment & Plan:  Marland KitchenMarland KitchenDiagnoses and all orders for this visit:  Anxiety -     escitalopram (LEXAPRO) 10 MG tablet; Take 1 tablet (10 mg total) by mouth daily. -     LORazepam (ATIVAN) 0.5 MG tablet;  Take 1 tablet (0.5 mg total) by mouth 2 (two) times daily as needed for anxiety.  Dysthymia -     escitalopram (LEXAPRO) 10 MG tablet; Take 1 tablet (10 mg total) by mouth daily.   .. Depression screen [facility] 2/9 08/06/2017 10/30/2016 07/18/2016  Decreased Interest 1 0 0  Down, Depressed, Hopeless - 0 0  PHQ - 2 Score 1 0 0  Altered sleeping 2 - 1  Tired, decreased energy 2 - 0  Change in appetite 3 - 0  Feeling bad or failure about yourself  2 - 0  Trouble concentrating 1 - 0  Moving slowly or fidgety/restless 0 - 0  Suicidal thoughts 0 - 0  PHQ-9 Score 11 - 1  Difficult doing work/chores Somewhat difficult - -   .[name] GAD 7 : Generalized Anxiety Score 08/06/2017 07/18/2016  Nervous, Anxious, on Edge 2 0  Control/stop worrying 2 0  Worry too much - different things 2 0  Trouble relaxing 2 0  Restless 1 0  Easily annoyed or irritable 2 0  Afraid - awful might happen 0 0  Total GAD 7 Score 11 0  Anxiety Difficulty Somewhat difficult Not difficult at all    Start lexapro and as needed ativan. Discussed dependence and risk of abuse of ativan. Pt aware to use sparingly. She is aware lexapro can take some  time to work. Reach out with any suicidal thoughts. For sleep discussed good sleep routine. Encouraged use of essential oils, CBD oil, unisom etc. Encouraged counseling to learn good anxiety coping skills. Follow up in 4-6 weeks. I do not think IUD is causing these symptoms. Will continue to monitor.   [name]..Spent 30 minutes with patient and greater than 50 percent of visit spent counseling patient regarding treatment plan.

## 2017-08-06 NOTE — Patient Instructions (Addendum)
CBD oil for sleep.    Living With Anxiety After being diagnosed with an anxiety disorder, you may be relieved to know why you have felt or behaved a certain way. It is natural to also feel overwhelmed about the treatment ahead and what it will mean for your life. With care and support, you can manage this condition and recover from it. How to cope with anxiety Dealing with stress Stress is your body's reaction to life changes and events, both good and bad. Stress can last just a few hours or it can be ongoing. Stress can play a major role in anxiety, so it is important to learn both how to cope with stress and how to think about it differently. Talk with your health care provider or a counselor to learn more about stress reduction. He or she may suggest some stress reduction techniques, such as:  Music therapy. This can include creating or listening to music that you enjoy and that inspires you.  Mindfulness-based meditation. This involves being aware of your normal breaths, rather than trying to control your breathing. It can be done while sitting or walking.  Centering prayer. This is a kind of meditation that involves focusing on a word, phrase, or sacred image that is meaningful to you and that brings you peace.  Deep breathing. To do this, expand your stomach and inhale slowly through your nose. Hold your breath for 3-5 seconds. Then exhale slowly, allowing your stomach muscles to relax.  Self-talk. This is a skill where you identify thought patterns that lead to anxiety reactions and correct those thoughts.  Muscle relaxation. This involves tensing muscles then relaxing them.  Choose a stress reduction technique that fits your lifestyle and personality. Stress reduction techniques take time and practice. Set aside 5-15 minutes a day to do them. Therapists can offer training in these techniques. The training may be covered by some insurance plans. Other things you can do to manage stress  include:  Keeping a stress diary. This can help you learn what triggers your stress and ways to control your response.  Thinking about how you respond to certain situations. You may not be able to control everything, but you can control your reaction.  Making time for activities that help you relax, and not feeling guilty about spending your time in this way.  Therapy combined with coping and stress-reduction skills provides the best chance for successful treatment. Medicines Medicines can help ease symptoms. Medicines for anxiety include:  Anti-anxiety drugs.  Antidepressants.  Beta-blockers.  Medicines may be used as the main treatment for anxiety disorder, along with therapy, or if other treatments are not working. Medicines should be prescribed by a health care provider. Relationships Relationships can play a big part in helping you recover. Try to spend more time connecting with trusted friends and family members. Consider going to couples counseling, taking family education classes, or going to family therapy. Therapy can help you and others better understand the condition. How to recognize changes in your condition Everyone has a different response to treatment for anxiety. Recovery from anxiety happens when symptoms decrease and stop interfering with your daily activities at home or work. This may mean that you will start to:  Have better concentration and focus.  Sleep better.  Be less irritable.  Have more energy.  Have improved memory.  It is important to recognize when your condition is getting worse. Contact your health care provider if your symptoms interfere with home or work and  you do not feel like your condition is improving. Where to find help and support: You can get help and support from these sources:  Self-help groups.  Online and [employer].  A trusted spiritual leader.  Couples counseling.  Family education classes.  Family  therapy.  Follow these instructions at home:  Eat a healthy diet that includes plenty of vegetables, fruits, whole grains, low-fat dairy products, and lean protein. Do not eat a lot of foods that are high in solid fats, added sugars, or salt.  Exercise. Most adults should do the following: ? Exercise for at least 150 minutes each week. The exercise should increase your heart rate and make you sweat (moderate-intensity exercise). ? Strengthening exercises at least twice a week.  Cut down on caffeine, tobacco, alcohol, and other potentially harmful substances.  Get the right amount and quality of sleep. Most adults need 7-9 hours of sleep each night.  Make choices that simplify your life.  Take over-the-counter and prescription medicines only as told by your health care provider.  Avoid caffeine, alcohol, and certain over-the-counter cold medicines. These may make you feel worse. Ask your pharmacist which medicines to avoid.  Keep all follow-up visits as told by your health care provider. This is important. Questions to ask your health care provider  Would I benefit from therapy?  How often should I follow up with a health care provider?  How long do I need to take medicine?  Are there any long-term side effects of my medicine?  Are there any alternatives to taking medicine? Contact a health care provider if:  You have a hard time staying focused or finishing daily tasks.  You spend many hours a day feeling worried about everyday life.  You become exhausted by worry.  You start to have headaches, feel tense, or have nausea.  You urinate more than normal.  You have diarrhea. Get help right away if:  You have a racing heart and shortness of breath.  You have thoughts of hurting yourself or others. If you ever feel like you may hurt yourself or others, or have thoughts about taking your own life, get help right away. You can go to your nearest emergency department or  call:  Your local emergency services (911 in the [place]  A suicide crisis helpline, such as the National Suicide Prevention Lifeline at 740-433-1306. This is open 24-hours a day.  Summary  Taking steps to deal with stress can help calm you.  Medicines cannot cure anxiety disorders, but they can help ease symptoms.  Family, friends, and partners can play a big part in helping you recover from an anxiety disorder. This information is not intended to replace advice given to you by your health care provider. Make sure you discuss any questions you have with your health care provider. Document Released: 12/19/2015 Document Revised: 12/19/2015 Document Reviewed: 12/19/2015 Elsevier Interactive Patient Education  [employer].

## 2017-08-25 ENCOUNTER — Other Ambulatory Visit: Payer: Self-pay

## 2017-08-25 ENCOUNTER — Other Ambulatory Visit: Payer: Self-pay | Admitting: Physician Assistant

## 2017-08-25 MED ORDER — METOPROLOL SUCCINATE ER 25 MG PO TB24: 25.0000 mg | ORAL_TABLET | Freq: Every day | ORAL | 3 refills | Status: DC

## 2017-08-29 ENCOUNTER — Other Ambulatory Visit: Payer: Self-pay | Admitting: Physician Assistant

## 2017-08-29 DIAGNOSIS — F341 Dysthymic disorder: Secondary | ICD-10-CM

## 2017-08-29 DIAGNOSIS — F419 Anxiety disorder, unspecified: Secondary | ICD-10-CM

## 2017-09-01 ENCOUNTER — Other Ambulatory Visit: Payer: Self-pay

## 2017-09-01 ENCOUNTER — Emergency Department (INDEPENDENT_AMBULATORY_CARE_PROVIDER_SITE_OTHER)
Admission: EM | Admit: 2017-09-01 | Discharge: 2017-09-01 | Disposition: A | Discharge status: Home / Self Care | Payer: 59 | Source: Home / Self Care | Attending: Family Medicine | Admitting: Family Medicine

## 2017-09-01 ENCOUNTER — Emergency Department (HOSPITAL_BASED_OUTPATIENT_CLINIC_OR_DEPARTMENT_OTHER)
Admission: EM | Admit: 2017-09-01 | Discharge: 2017-09-01 | Disposition: A | Discharge status: Home / Self Care | Payer: 59 | Attending: Emergency Medicine | Admitting: Emergency Medicine

## 2017-09-01 ENCOUNTER — Encounter (HOSPITAL_BASED_OUTPATIENT_CLINIC_OR_DEPARTMENT_OTHER): Payer: Self-pay | Admitting: *Deleted

## 2017-09-01 ENCOUNTER — Encounter: Payer: Self-pay | Admitting: Emergency Medicine

## 2017-09-01 DIAGNOSIS — R001 Bradycardia, unspecified: Secondary | ICD-10-CM

## 2017-09-01 DIAGNOSIS — Z8679 Personal history of other diseases of the circulatory system: Secondary | ICD-10-CM | POA: Insufficient documentation

## 2017-09-01 DIAGNOSIS — R079 Chest pain, unspecified: Secondary | ICD-10-CM | POA: Diagnosis present

## 2017-09-01 DIAGNOSIS — R55 Syncope and collapse: Secondary | ICD-10-CM | POA: Diagnosis not present

## 2017-09-01 DIAGNOSIS — M940 Chondrocostal junction syndrome [Tietze]: Secondary | ICD-10-CM | POA: Insufficient documentation

## 2017-09-01 LAB — CBG MONITORING, ED: GLUCOSE-CAPILLARY: 69 mg/dL — AB (ref 70–99)

## 2017-09-01 MED ORDER — NAPROXEN 500 MG PO TABS: 500.0000 mg | ORAL_TABLET | Freq: Two times a day (BID) | ORAL | 0 refills | Status: DC

## 2017-09-01 MED FILL — NAPROXEN 500 MG TABLET: 500 | 15 days supply | Qty: 30 | Fill #0

## 2017-09-01 NOTE — Discharge Instructions (Addendum)
You were evaluated in the Emergency Department and after careful evaluation, we did not find any emergent condition requiring admission or further testing in the hospital.  Your low heart rate is likely related to the extra dose of your medications last night.  Please allow yourself to rest today, and take extra time when standing from a seated position.  Keep your appointment with your cardiologist tomorrow morning.  Use the medication provided as needed for pain.  Please return to the Emergency Department if you experience any worsening of your condition.  We encourage you to follow up with a primary care provider.  Thank you for allowing us to be a part of your care.

## 2017-09-01 NOTE — ED Triage Notes (Addendum)
Sharp chest pain since last night. [facility] and told to come here for further evaluation.

## 2017-09-01 NOTE — ED Triage Notes (Signed)
Pt c/o chest pain that started last night. States she fainted when she woke up this morning. Denies injury. Saw cardio last year for f/u on svt and afib.

## 2017-09-01 NOTE — ED Provider Notes (Signed)
[facility] Emergency Department Provider Note MRN:  [id number]  Arrival date & time: 09/01/17     Chief Complaint   Syncope and chest pain History of Present Illness   Kelsey Dorsey is a 32 y.o. year-old female with no pertinent past medical history presenting to the ED with chief complaint of syncope and chest pain.  Patient had her first episode of syncope several weeks ago, was evaluated at that time and discharge.  Has an appointment with her cardiologist tomorrow.  Patient had some new chest pain that began suddenly last night.  Pain is located in the right upper chest, sharp, worse with palpation, worse with deep breathing.  Patient took extra doses of her Toprol and nitroglycerin which did not help.  She then stood from a laying position, experienced dizziness, then had a syncopal episode.  Fell onto her right side, quick return to consciousness and baseline.  No head trauma, no loss of consciousness, endorsing mild right thigh pain.  Per chart review, cardiology is following her and suspecting intermittent SVT.  Review of Systems  A complete 10 system review of systems was obtained and all systems are negative except as noted in the HPI and PMH.   Patient's Health History    Past Medical History:  Diagnosis Date  . Palpitations     Past Surgical History:  Procedure Laterality Date  . KNEE SURGERY      Family History  Problem Relation Age of Onset  . Hyperlipidemia Mother   . Hypertension Mother   . Diabetes Mother   . Heart attack Mother   . Depression Father     Social History   Socioeconomic History  . Marital status: Single    Spouse name: Not on file  . Number of children: Not on file  . Years of education: Not on file  . Highest education level: Not on file  Occupational History    Comment: Financial planner for [employer]  Social Needs  . Financial resource strain: Not on file  . Food insecurity:    Worry: Not on file   Inability: Not on file  . Transportation needs:    Medical: Not on file    Non-medical: Not on file  Tobacco Use  . Smoking status: Never Smoker  . Smokeless tobacco: Never Used  Substance and Sexual Activity  . Alcohol use: Yes    Comment: Rare  . Drug use: No  . Sexual activity: Not Currently  Lifestyle  . Physical activity:    Days per week: Not on file    Minutes per session: Not on file  . Stress: Not on file  Relationships  . Social connections:    Talks on phone: Not on file    Gets together: Not on file    Attends religious service: Not on file    Active member of club or organization: Not on file    Attends meetings of clubs or organizations: Not on file    Relationship status: Not on file  . Intimate partner violence:    Fear of current or ex partner: Not on file    Emotionally abused: Not on file    Physically abused: Not on file    Forced sexual activity: Not on file  Other Topics Concern  . Not on file  Social History Narrative  . Not on file     Physical Exam  Vital Signs and Nursing Notes reviewed Vitals:   09/01/17 1108 09/01/17  1237  BP: 121/79 106/64  Pulse: (!) 56 (!) 50  Resp: 14 16  Temp: 98.2 F (36.8 C)   SpO2: 100% 100%    CONSTITUTIONAL: Well-appearing, NAD NEURO:  Alert and oriented x 3, no focal deficits EYES:  eyes equal and reactive ENT/NECK:  no LAD, no JVD CARDIO: Bradycardic rate, well-perfused, normal S1 and S2 PULM:  CTAB no wheezing or rhonchi GI/GU:  normal bowel sounds, non-distended, non-tender MSK/SPINE:  No gross deformities, no edema SKIN:  no rash, atraumatic PSYCH:  Appropriate speech and behavior  Diagnostic and Interventional Summary    EKG Interpretation  Date/Time:  Monday September 01 2017 11:12:45 EDT Ventricular Rate:  50 PR Interval:  156 QRS Duration: 86 QT Interval:  446 QTC Calculation: 406 R Axis:   103 Text Interpretation:  Sinus bradycardia Biatrial enlargement Rightward axis Pulmonary disease  pattern Abnormal ECG Confirmed by Kennis CarinaBero, Liberty Seto [phone] on 09/01/2017 12:31:39 PM      Labs Reviewed  CBG MONITORING, ED - Abnormal; Notable for the following components:      Result Value   Glucose-Capillary 69 (*)    All other components within normal limits    No orders to display    Medications - No data to display   Procedures Critical Care  ED Course and Medical Decision Making  I have reviewed the triage vital signs and the nursing notes.  Pertinent labs & imaging results that were available during my care of the patient were reviewed by me and considered in my medical decision making (see below for details).    32 year old female followed closely by cardiology, likely having intermittent SVT, but per cardiology notes better controlled on metoprolol recently.  Here with chest pain consistent with costochondritis.  Patient with no risk factors for PE, PERC negative.  No risk factors for ACS.  Reproducible on exam.  Will check screening EKG and glucose, patient with close follow-up with cardiology tomorrow.  Mild bradycardia today but with perfectly normal perfusion, likely related to extra dose of Toprol taken last night.  Patient and family agree that she will be monitored very closely at home, will take it easy for the rest of the day.  After the discussed management above, the patient was determined to be safe for discharge.  The patient was in agreement with this plan and all questions regarding their care were answered.  ED return precautions were discussed and the patient will return to the ED with any significant worsening of condition.  Elmer SowMichael M. Pilar PlateBero, MD [facility] [email or web address]  Final Clinical Impressions(s) / ED Diagnoses     ICD-10-CM   1. Costochondritis M94.0   2. Bradycardia R00.1     ED Discharge Orders         Ordered    naproxen (NAPROSYN) 500 MG tablet  2 times daily     09/01/17 1254              Sabas SousBero, Sonyia Muro M, MD 09/01/17 1257

## 2017-09-01 NOTE — ED Notes (Signed)
Pt eating gram crackers with peanut butter, and salting crackers. Pt drinking water.

## 2017-09-01 NOTE — ED Provider Notes (Signed)
[facility]    CSN: [id number] Arrival date & time: 09/01/17  1008     History   Chief Complaint Chief Complaint  Patient presents with  . Chest Pain    HPI Kelsey Dorsey is a 32 y.o. female.   Patient has a history of sinus tachycardia controlled by Toprol-XL 25mg , one-half tab daily.  Last night she suddenly developed anterior chest pain/tightness at 8pm, complaining of difficulty with inspiration.  She consequently took and extra dose of Toprol, as well as 3 doses of NTG SL and 3 baby aspirins.  She also took a lorazepam tab.  This morning at about 8am she felt light-headed, and lost consciousness, awakening instantly after her fall.  She still has tightness in her anterior chest, worse with inspiration and movement.  She denies cough, fevers, chills, and sweats, nausea/vomiting.  The history is provided by the patient and a parent.  Chest Pain  Pain location:  Substernal area Pain quality: aching   Pain radiates to:  Does not radiate Pain severity:  Moderate Onset quality:  Sudden Duration:  15 hours Timing:  Constant Progression:  Unchanged Chronicity:  New Context: breathing, movement and at rest   Relieved by:  Nothing Worsened by:  Deep breathing and certain positions Associated symptoms: anxiety, shortness of breath and syncope   Associated symptoms: no abdominal pain, no AICD problem, no back pain, no claudication, no cough, no diaphoresis, no dizziness, no dysphagia, no fatigue, no fever, no headache, no heartburn, no lower extremity edema, no nausea, no numbness, no palpitations, no PND and no vomiting     Past Medical History:  Diagnosis Date  . Palpitations     Patient Active Problem List   Diagnosis Date Noted  . Dysthymia 08/06/2017  . Anxiety 08/06/2017  . Trouble in sleeping 08/06/2017  . Palpitations 04/15/2016  . Chest pain 04/15/2016  . Chest tightness 04/15/2016  . Sinus arrhythmia seen on electrocardiogram 11/10/2015  . Migraine  11/08/2014    Past Surgical History:  Procedure Laterality Date  . KNEE SURGERY      OB History   None      Home Medications    Prior to Admission medications   Medication Sig Start Date End Date Taking? Authorizing Provider  Diclofenac Potassium 50 MG PACK Take 50 mg by mouth 2 (two) times daily as needed. For migraines. 03/14/17   Breeback, Jade L, PA-C  escitalopram (LEXAPRO) 10 MG tablet Take 1 tablet (10 mg total) by mouth daily. 30 DAY SUPPLY GIVEN.MUST SCHEDULE AND KEEP APPOINTMENT FOR REFILLS 08/29/17   Jomarie Longs, PA-C  Levonorgestrel (KYLEENA) 19.5 MG IUD 1 Device by Intrauterine route once for 1 dose. Inserted 01/20/2017 by Dr. Lyn Hollingshead. 01/20/17 01/20/17  Sunnie Nielsen, DO  LORazepam (ATIVAN) 0.5 MG tablet Take 1 tablet (0.5 mg total) by mouth 2 (two) times daily as needed for anxiety. 08/06/17   Breeback, Jade L, PA-C  metoprolol succinate (TOPROL-XL) 25 MG 24 hr tablet Take 1 tablet (25 mg total) by mouth daily. 08/25/17   Jomarie Longs, PA-C    Family History Family History  Problem Relation Age of Onset  . Hyperlipidemia Mother   . Hypertension Mother   . Diabetes Mother   . Heart attack Mother   . Depression Father     Social History Social History   Tobacco Use  . Smoking status: Never Smoker  . Smokeless tobacco: Never Used  Substance Use Topics  . Alcohol use: Yes  Comment: Rare  . Drug use: No     Allergies   Codeine; Cephalexin; and Morphine and related   Review of Systems Review of Systems  Constitutional: Negative for diaphoresis, fatigue and fever.  HENT: Negative for trouble swallowing.   Respiratory: Positive for chest tightness and shortness of breath. Negative for cough, wheezing and stridor.   Cardiovascular: Positive for chest pain and syncope. Negative for palpitations, claudication and PND.  Gastrointestinal: Negative for abdominal pain, heartburn, nausea and vomiting.  Musculoskeletal: Negative for back pain.    Neurological: Negative for dizziness, numbness and headaches.  All other systems reviewed and are negative.    Physical Exam Triage Vital Signs ED Triage Vitals  Enc Vitals Group     BP 09/01/17 1027 119/86     Pulse Rate 09/01/17 1027 (!) 54     Resp --      Temp 09/01/17 1027 98 F (36.7 C)     Temp src --      SpO2 09/01/17 1027 100 %     Weight --      Height --      Head Circumference --      Peak Flow --      Pain Score 09/01/17 1041 7     Pain Loc --      Pain Edu? --      Excl. in GC? --    No data found.  Updated Vital Signs BP 119/86 (BP Location: Right Arm)   Pulse (!) 54   Temp 98 F (36.7 C)   SpO2 100%   Visual Acuity Right Eye Distance:   Left Eye Distance:   Bilateral Distance:    Right Eye Near:   Left Eye Near:    Bilateral Near:     Physical Exam Nursing notes and Vital Signs reviewed. Appearance:  Patient appears stated age, and in no acute distress.  She is alert and oriented. Eyes:  Pupils are equal, round, and reactive to light and accomodation.  Extraocular movement is intact.  Conjunctivae are not inflamed  Ears:  Normal externally Nose: Normal Pharynx:  Normal; moist mucous membranes  Neck:  Supple.  No adenopathy.   Lungs:  Clear to auscultation.  Breath sounds are equal.  Moving air well. Chest:  Distinct tenderness to palpation over the mid-sternum.  Palpation over her sternum recreates her chest pain. Heart:  Regular rate and rhythm without murmurs, rubs, or gallops.  Rate 54. Abdomen:  Nontender without masses or hepatosplenomegaly.  Bowel sounds are present.  No CVA or flank tenderness.  Extremities:  No edema.  There is tenderness and mild hematoma right anterior/lateral thigh. Skin:  No rash present.  Skin warm and dry.  UC Treatments / Results  Labs (all labs ordered are listed, but only abnormal results are displayed) Labs Reviewed - No data to display  EKG  Rate:  50 BPM PR:  162 msec QT:  440 msec QTcH:  401  msec QRSD:  96 msec QRS axis:  90 degrees Interpretation:  Sinus bradycardia; no significant change from tracing 12/27/16 except for rate.  Radiology No results found.  Procedures Procedures (including critical care time)  Medications Ordered in UC Medications - No data to display  Initial Impression / Assessment and Plan / UC Course  I have reviewed the triage vital signs and the nursing notes.  Pertinent labs & imaging results that were available during my care of the patient were reviewed by me and considered in my  medical decision making (see chart for details).    Patient appears to have developed costochondritis yesterday, and last night took an extra dose of her metoprolol because of her chest pain, as well as 3 nitroglycerin tabs and dose of lorazepam.  This morning she took her usual dose of metoprolol, resulting in sinus bradycardia and brief episode of syncope. Today's EKG similar to EKG performed 12/27/16 except for sinus bradycardia (rate 50); no other suspicious changes. Prefer that patient undergo further evaluation ER as a precaution.  She declines transport by EMS.  Her vital signs are stable, and I believe that she is safe to travel by private vehicle (mother to drive) [facility].   Final Clinical Impressions(s) / UC Diagnoses   Final diagnoses:  Sinus bradycardia by electrocardiogram  Costochondritis  Syncope and collapse   Discharge Instructions   None    ED Prescriptions    None         Lattie HawBeese, Stephen A, MD 09/01/17 1106

## 2017-09-02 ENCOUNTER — Encounter: Payer: Self-pay | Admitting: Cardiology

## 2017-09-02 ENCOUNTER — Ambulatory Visit (INDEPENDENT_AMBULATORY_CARE_PROVIDER_SITE_OTHER): Payer: 59 | Admitting: Cardiology

## 2017-09-02 VITALS — BP 124/80 | HR 57 | Ht 70.0 in | Wt 135.6 lb

## 2017-09-02 DIAGNOSIS — R002 Palpitations: Secondary | ICD-10-CM

## 2017-09-02 DIAGNOSIS — R55 Syncope and collapse: Secondary | ICD-10-CM | POA: Diagnosis not present

## 2017-09-02 DIAGNOSIS — R0789 Other chest pain: Secondary | ICD-10-CM

## 2017-09-02 DIAGNOSIS — F341 Dysthymic disorder: Secondary | ICD-10-CM | POA: Diagnosis not present

## 2017-09-02 MED ORDER — BISOPROLOL FUMARATE 5 MG PO TABS: 2.5000 mg | ORAL_TABLET | Freq: Every day | ORAL | 6 refills | Status: DC

## 2017-09-02 NOTE — Assessment & Plan Note (Signed)
Seen in ED 09/01/17 with chest pain and syncope.

## 2017-09-02 NOTE — Progress Notes (Signed)
09/02/2017 Kelsey DoomsEmily R Dorsey   10/11/1985  [id number]  Primary Physician Jomarie LongsBreeback, Jade L, PA-C Primary Cardiologist: Dr Jens Somrenshaw  HPI:  Pleasant 32 y/o female, here with her mother after an ED visit 09/01/17. The pt has a history of palpitations, suspected PSVT, and anxiety/ depression. She saw Dr Jens Somrenshaw in April 2018 after she had two separate episodes of awaking from sleep with sudden onset of heart racing and chest pain. Dr Jens Somrenshaw was suspicious her symptoms were from PSVT, (though I don't see where a monitor was done). Beta blocker was added- Toprol 25 mg. She could not tolerate this dose (fatigue) and decreased it to 12.5 mg daily. An echo was done and was normal. She only saw us that one time in April 2018.   The pt was seen by her PCP 08/06/17 for mood disorder. Lexapro was added. On 09/01/17 she presented to Urgent Care with anterior chest pain, worse with deep breathing and worse with movement. She took an extra Toprol as well as 3 SL NTG (?) and 3 baby ASA.  The next morning in the shower she had syncope but no injury. She went to Urgent Care and then was sent to the ED. Her HR was 50 in the ED. It was felt the extra Toprol, Sl NTG, and subsequent bradycardia and hypotension caused her syncope. Her chest pain was felt to be costochondritis.   She is in the office today with her mother. She still has chest pain though she admits it may be a little better since she was placed on a NSAID. She has a pleuritic and movement component. No SOB or hemoptysis. No recent viral illness. She admits to tachycardia off on and on but that history was not very clear. Her mother told me she has actually seen her daughter's heart beat fast in her chest.    Current Outpatient Medications  Medication Sig Dispense Refill  . escitalopram (LEXAPRO) 10 MG tablet Take 1 tablet (10 mg total) by mouth daily. 30 DAY SUPPLY GIVEN.MUST SCHEDULE AND KEEP APPOINTMENT FOR REFILLS 30 tablet 0  . LORazepam (ATIVAN) 0.5 MG  tablet Take 1 tablet (0.5 mg total) by mouth 2 (two) times daily as needed for anxiety. 30 tablet 0  . naproxen (NAPROSYN) 500 MG tablet Take 1 tablet (500 mg total) by mouth 2 (two) times daily. 30 tablet 0  . bisoprolol (ZEBETA) 5 MG tablet Take 0.5 tablets (2.5 mg total) by mouth daily. 15 tablet 6  . Levonorgestrel (KYLEENA) 19.5 MG IUD 1 Device by Intrauterine route once for 1 dose. Inserted 01/20/2017 by Dr. Lyn HollingsheadAlexander. 1 Intra Uterine Device 0   No current facility-administered medications for this visit.     Allergies  Allergen Reactions  . Codeine Itching  . Cephalexin Other (See Comments)  . Morphine And Related Other (See Comments)    Past Medical History:  Diagnosis Date  . Palpitations     Social History   Socioeconomic History  . Marital status: Single    Spouse name: Not on file  . Number of children: Not on file  . Years of education: Not on file  . Highest education level: Not on file  Occupational History    Comment: Financial plannerervice manager for [employer]  Social Needs  . Financial resource strain: Not on file  . Food insecurity:    Worry: Not on file    Inability: Not on file  . Transportation needs:    Medical: Not on file  Non-medical: Not on file  Tobacco Use  . Smoking status: Never Smoker  . Smokeless tobacco: Never Used  Substance and Sexual Activity  . Alcohol use: Yes    Comment: Rare  . Drug use: No  . Sexual activity: Not Currently  Lifestyle  . Physical activity:    Days per week: Not on file    Minutes per session: Not on file  . Stress: Not on file  Relationships  . Social connections:    Talks on phone: Not on file    Gets together: Not on file    Attends religious service: Not on file    Active member of club or organization: Not on file    Attends meetings of clubs or organizations: Not on file    Relationship status: Not on file  . Intimate partner violence:    Fear of current or ex partner: Not on file    Emotionally abused:  Not on file    Physically abused: Not on file    Forced sexual activity: Not on file  Other Topics Concern  . Not on file  Social History Narrative  . Not on file     Family History  Problem Relation Age of Onset  . Hyperlipidemia Mother   . Hypertension Mother   . Diabetes Mother   . Heart attack Mother   . Depression Father      Review of Systems: General: negative for chills, fever, night sweats or weight changes.  Cardiovascular: negative for dyspnea on exertion, edema, orthopnea, paroxysmal nocturnal dyspnea or shortness of breath Dermatological: negative for rash Respiratory: negative for cough or wheezing Urologic: negative for hematuria Abdominal: negative for nausea, vomiting, diarrhea, bright red blood per rectum, melena, or hematemesis Neurologic: negative for visual changes, syncope, or dizziness All other systems reviewed and are otherwise negative except as noted above.    Blood pressure 124/80, pulse (!) 57, height 5\' 10"  (1.778 m), weight 135 lb 9.6 oz (61.5 kg).  General appearance: alert, cooperative and no distress Neck: no carotid bruit and no JVD Lungs: clear to auscultation bilaterally Heart: regular rate and rhythm- no rub Abdomen: soft, non-tender; bowel sounds normal; no masses,  no organomegaly Extremities: extremities normal, atraumatic, no cyanosis or edema Pulses: 2+ and symmetric Skin: Skin color, texture, turgor normal. No rashes or lesions Neurologic: Grossly normal  EKG [id number]  ASSESSMENT AND PLAN:   Chest pain, atypical Seen in ED 09/01/17 with chest pain and syncope.  Palpitations R/O PSVT  Dysthymia Lexapro recently added   PLAN  Check 30 day monitor. She tells me her insurance no longer covers Toprol. I suggested we try Bisoprolol 2.5 mg daily. She should continue NSAID as directed. If she develops SOB, increased chest pain, or hemoptysis I would consider an echo and possibly a a chest CTA.   Corine Shelter  PA-C 09/02/2017 11:18 AM

## 2017-09-02 NOTE — Assessment & Plan Note (Signed)
Lexapro recently added

## 2017-09-02 NOTE — Assessment & Plan Note (Signed)
R/O PSVT

## 2017-09-02 NOTE — Patient Instructions (Addendum)
MEDICATION  INSTRUCTIONS  STOP METOPROLOL    START BISOPROLOL 2.5 MG ( 1/2 TABLET) BY MOUTH DAILY.     SCHEDULE AT [address] Your physician has recommended that you wear an event monitor 30 DAYS. Event monitors are medical devices that record the heart's electrical activity. Doctors most often us these monitors to diagnose arrhythmias. Arrhythmias are problems with the speed or rhythm of the heartbeat. The monitor is a small, portable device. You can wear one while you do your normal daily activities. This is usually used to diagnose what is causing palpitations/syncope (passing out).    Your physician wants you to follow-up in 6 MONTHS WITH DR CRENSHAW. You will receive a reminder letter in the mail two months in advance. If you don't receive a letter, please call our office to schedule the follow-up appointment.   If you need a refill on your cardiac medications before your next appointment, please call your pharmacy.

## 2017-09-03 ENCOUNTER — Encounter: Payer: Self-pay | Admitting: *Deleted

## 2017-09-03 ENCOUNTER — Ambulatory Visit (INDEPENDENT_AMBULATORY_CARE_PROVIDER_SITE_OTHER): Payer: 59

## 2017-09-03 DIAGNOSIS — R55 Syncope and collapse: Secondary | ICD-10-CM | POA: Diagnosis not present

## 2017-09-03 DIAGNOSIS — R002 Palpitations: Secondary | ICD-10-CM

## 2017-09-05 ENCOUNTER — Other Ambulatory Visit (INDEPENDENT_AMBULATORY_CARE_PROVIDER_SITE_OTHER): Payer: 59

## 2017-09-05 DIAGNOSIS — R002 Palpitations: Secondary | ICD-10-CM | POA: Diagnosis not present

## 2017-09-05 DIAGNOSIS — R0789 Other chest pain: Secondary | ICD-10-CM

## 2017-09-24 ENCOUNTER — Other Ambulatory Visit: Payer: Self-pay | Admitting: Physician Assistant

## 2017-09-24 DIAGNOSIS — F419 Anxiety disorder, unspecified: Secondary | ICD-10-CM

## 2017-09-24 DIAGNOSIS — F341 Dysthymic disorder: Secondary | ICD-10-CM

## 2018-02-13 ENCOUNTER — Other Ambulatory Visit: Payer: Self-pay

## 2018-02-13 DIAGNOSIS — F419 Anxiety disorder, unspecified: Secondary | ICD-10-CM

## 2018-02-13 MED ORDER — LORAZEPAM 0.5 MG PO TABS: 0.5000 mg | ORAL_TABLET | Freq: Two times a day (BID) | ORAL | 0 refills | Status: DC | PRN

## 2018-02-25 ENCOUNTER — Other Ambulatory Visit: Payer: Self-pay | Admitting: Cardiology

## 2018-02-25 NOTE — Telephone Encounter (Signed)
Rx(s) sent to pharmacy electronically.  

## 2018-03-06 ENCOUNTER — Other Ambulatory Visit: Payer: Self-pay | Admitting: Physician Assistant

## 2018-03-06 DIAGNOSIS — F419 Anxiety disorder, unspecified: Secondary | ICD-10-CM

## 2018-03-06 DIAGNOSIS — F341 Dysthymic disorder: Secondary | ICD-10-CM

## 2018-03-14 ENCOUNTER — Other Ambulatory Visit: Payer: Self-pay

## 2018-03-14 ENCOUNTER — Emergency Department (HOSPITAL_COMMUNITY)
Admission: EM | Admit: 2018-03-14 | Discharge: 2018-03-15 | Disposition: A | Discharge status: Home / Self Care | Payer: Self-pay | Attending: Emergency Medicine | Admitting: Emergency Medicine

## 2018-03-14 ENCOUNTER — Emergency Department (HOSPITAL_COMMUNITY): Payer: Self-pay

## 2018-03-14 ENCOUNTER — Encounter (HOSPITAL_COMMUNITY): Payer: Self-pay

## 2018-03-14 DIAGNOSIS — R079 Chest pain, unspecified: Secondary | ICD-10-CM | POA: Insufficient documentation

## 2018-03-14 DIAGNOSIS — Z79899 Other long term (current) drug therapy: Secondary | ICD-10-CM | POA: Insufficient documentation

## 2018-03-14 LAB — BASIC METABOLIC PANEL
Anion gap: 8 (ref 5–15)
BUN: 14 mg/dL (ref 6–20)
CHLORIDE: 104 mmol/L (ref 98–111)
CO2: 23 mmol/L (ref 22–32)
CREATININE: 0.72 mg/dL (ref 0.44–1.00)
Calcium: 8.8 mg/dL — ABNORMAL LOW (ref 8.9–10.3)
GFR calc Af Amer: >60 mL/min (ref >60)
GFR calc non Af Amer: >60 mL/min (ref >60)
GLUCOSE: 97 mg/dL (ref 70–99)
Potassium: 3.6 mmol/L (ref 3.5–5.1)
SODIUM: 135 mmol/L (ref 135–145)

## 2018-03-14 LAB — I-STAT TROPONIN, ED
TROPONIN I, POC: 0 ng/mL (ref 0.00–0.08)
Troponin i, poc: 0 ng/mL (ref 0.00–0.08)

## 2018-03-14 LAB — I-STAT BETA HCG BLOOD, ED (MC, WL, AP ONLY): I-stat hCG, quantitative: <5 m[IU]/mL (ref <5)

## 2018-03-14 LAB — CBC
HEMATOCRIT: 41.5 % (ref 36.0–46.0)
Hemoglobin: 13.7 g/dL (ref 12.0–15.0)
MCH: 31.4 pg (ref 26.0–34.0)
MCHC: 33 g/dL (ref 30.0–36.0)
MCV: 95 fL (ref 80.0–100.0)
Platelets: 303 10*3/uL (ref 150–400)
RBC: 4.37 MIL/uL (ref 3.87–5.11)
RDW: 12.2 % (ref 11.5–15.5)
WBC: 8.6 10*3/uL (ref 4.0–10.5)
nRBC: 0 % (ref 0.0–0.2)

## 2018-03-14 MED ORDER — SODIUM CHLORIDE 0.9% FLUSH: 3.0000 mL | Freq: Once | INTRAVENOUS | Status: DC

## 2018-03-14 NOTE — ED Triage Notes (Signed)
Pt reports chest pain that started around 4pm today. Reports radiation down left arm. No distress noted but some discomfort present. Pt states hx of SVT.

## 2018-03-14 NOTE — ED Provider Notes (Signed)
[facility] EMERGENCY DEPARTMENT Provider Note   CSN: [id number] Arrival date & time: 03/14/18  1858    History   Chief Complaint Chief Complaint  Patient presents with  . Chest Pain    HPI Kelsey Dorsey is a 33 y.o. female.      Chest Pain  Pain location:  L chest Pain quality: sharp   Pain radiates to:  Does not radiate Pain severity:  Mild Onset quality:  Gradual Duration:  4 hours Timing:  Constant Progression:  Unchanged Chronicity:  Recurrent Context: at rest   Relieved by:  Nothing Worsened by:  Nothing Ineffective treatments:  None tried Associated symptoms: no abdominal pain, no altered mental status, no back pain, no cough, no dizziness, no fever, no lower extremity edema, no near-syncope, no palpitations, no shortness of breath, no vomiting and no weakness   Risk factors: no coronary artery disease, no immobilization and no prior DVT/PE     Past Medical History:  Diagnosis Date  . Palpitations     Patient Active Problem List   Diagnosis Date Noted  . Dysthymia 08/06/2017  . Anxiety 08/06/2017  . Trouble in sleeping 08/06/2017  . Palpitations 04/15/2016  . Chest pain, atypical 04/15/2016  . Chest tightness 04/15/2016  . Sinus arrhythmia seen on electrocardiogram 11/10/2015  . Migraine 11/08/2014    Past Surgical History:  Procedure Laterality Date  . KNEE SURGERY       OB History   No obstetric history on file.      Home Medications    Prior to Admission medications   Medication Sig Start Date End Date Taking? Authorizing Provider  bisoprolol (ZEBETA) 5 MG tablet Take 0.5 tablets (2.5 mg total) by mouth daily. 02/25/18   Lewayne Bunting, MD  escitalopram (LEXAPRO) 10 MG tablet TAKE 1 TABLET BY MOUTH EVERY DAY. MUST SCHEDULE AND KEEP APPOINTMENT FOR REFILLS 03/09/18   Jomarie Longs, PA-C  Levonorgestrel (KYLEENA) 19.5 MG IUD 1 Device by Intrauterine route once for 1 dose. Inserted 01/20/2017 by Dr. Lyn Hollingshead. 01/20/17  01/20/17  Sunnie Nielsen, DO  LORazepam (ATIVAN) 0.5 MG tablet Take 1 tablet (0.5 mg total) by mouth 2 (two) times daily as needed for anxiety. Appointment MUST be made for refills. 02/13/18   Breeback, Jade L, PA-C  naproxen (NAPROSYN) 500 MG tablet Take 1 tablet (500 mg total) by mouth 2 (two) times daily. 09/01/17   Sabas Sous, MD    Family History Family History  Problem Relation Age of Onset  . Hyperlipidemia Mother   . Hypertension Mother   . Diabetes Mother   . Heart attack Mother   . Depression Father     Social History Social History   Tobacco Use  . Smoking status: Never Smoker  . Smokeless tobacco: Never Used  Substance Use Topics  . Alcohol use: Yes    Comment: Rare  . Drug use: No     Allergies   Codeine; Cephalexin; and Morphine and related   Review of Systems Review of Systems  Constitutional: Negative for chills and fever.  HENT: Negative for ear pain and sore throat.   Eyes: Negative for pain and visual disturbance.  Respiratory: Negative for cough and shortness of breath.   Cardiovascular: Positive for chest pain. Negative for palpitations and near-syncope.  Gastrointestinal: Negative for abdominal pain and vomiting.  Genitourinary: Negative for dysuria and hematuria.  Musculoskeletal: Negative for arthralgias and back pain.  Skin: Negative for color change and rash.  Neurological:  Negative for dizziness, seizures, syncope and weakness.  All other systems reviewed and are negative.    Physical Exam Updated Vital Signs BP 115/72   Pulse 68   Temp 98.4 F (36.9 C) (Oral)   Resp 14   Ht 5\' 10"  (1.778 m)   Wt 62.6 kg   SpO2 100%   BMI 19.80 kg/m   Physical Exam Vitals signs and nursing note reviewed.  Constitutional:      General: She is not in acute distress.    Appearance: She is well-developed.     Comments: Patient resting comfortably, no acute distress.  HENT:     Head: Normocephalic and atraumatic.  Eyes:     Extraocular  Movements: Extraocular movements intact.     Conjunctiva/sclera: Conjunctivae normal.     Pupils: Pupils are equal, round, and reactive to light.  Neck:     Musculoskeletal: Normal range of motion and neck supple.  Cardiovascular:     Rate and Rhythm: Normal rate and regular rhythm.     Heart sounds: No murmur.  Pulmonary:     Effort: Pulmonary effort is normal. No respiratory distress.     Breath sounds: Normal breath sounds.  Abdominal:     Palpations: Abdomen is soft.     Tenderness: There is no abdominal tenderness.  Musculoskeletal: Normal range of motion.  Skin:    General: Skin is warm and dry.     Capillary Refill: Capillary refill takes less than 2 seconds.  Neurological:     General: No focal deficit present.     Mental Status: She is alert. She is disoriented.     Cranial Nerves: No cranial nerve deficit.     Motor: No weakness.  Psychiatric:        Mood and Affect: Mood normal.      ED Treatments / Results  Labs (all labs ordered are listed, but only abnormal results are displayed) Labs Reviewed  BASIC METABOLIC PANEL - Abnormal; Notable for the following components:      Result Value   Calcium 8.8 (*)    All other components within normal limits  CBC  I-STAT TROPONIN, ED  I-STAT BETA HCG BLOOD, ED ([facility], [facility], [facility] ONLY)  I-STAT TROPONIN, ED    EKG EKG Interpretation  Date/Time:  Saturday March 14 2018 19:08:22 EST Ventricular Rate:  53 PR Interval:  148 QRS Duration: 88 QT Interval:  442 QTC Calculation: 414 R Axis:   101 Text Interpretation:  Sinus bradycardia Rightward axis Abnormal ECG poor baseline Confirmed by Blane Ohara [phone]) on 03/14/2018 9:22:18 PM   Radiology Dg Chest 2 View  Result Date: 03/14/2018 CLINICAL DATA:  Dry cough, shortness of Breath EXAM: CHEST - 2 VIEW COMPARISON:  07/18/2016 FINDINGS: Heart and mediastinal contours are within normal limits. No focal opacities or effusions. No acute bony abnormality. IMPRESSION: No active  cardiopulmonary disease. Electronically Signed   By: Charlett Nose M.D.   On: 03/14/2018 20:52    Procedures Procedures (including critical care time)  Medications Ordered in ED Medications  sodium chloride flush (NS) 0.9 % injection 3 mL (3 mLs Intravenous Not Given 03/14/18 2359)  ondansetron (ZOFRAN-ODT) disintegrating tablet 4 mg (4 mg Oral Given 03/15/18 0018)     Initial Impression / Assessment and Plan / ED Course  I have reviewed the triage vital signs and the nursing notes.  Pertinent labs & imaging results that were available during my care of the patient were reviewed by me and considered in  my medical decision making (see chart for details).        33 year old female significant past medical history of SVT who takes 2.5 mg of Zebeta daily and lives with intermittent palpitations.  She indicates that she started having chest pain sharp in nature in the left chest lasting approximately 4 hours now.  Patient denies any infectious-like symptoms, mild sensation change in left upper arm.  Concern for ACS, arrhythmia and other acute cardiopulmonary etiologies, anxiety.  Physical exam positive for intermittent bradycardia into the 50s, asymptomatic.  EKG shows no ST elevation or depression, appropriate intervals, appropriate T waves, similar to prior.  Will obtain delta troponin due to heart score of 1.  Chest x-ray negative for any acute pathology.  Do not believe pneumonia or other infectious etiologies is at play.  Troponin negative, other laboratory studies reviewed, no acute electrolyte derangement.  We will give patient some Zofran for mild nausea, no vomiting.  Based on physical exam, laboratory studies patient is stable to follow-up with primary care provider regarding symptoms.  Patient, family in agreement with this plan.  Patient discharged in stable condition with stable vital signs.  The above care was discussed and agreed upon by my attending physician.  Final Clinical  Impressions(s) / ED Diagnoses   Final diagnoses:  Chest pain, unspecified type    ED Discharge Orders    None       Dahlia Clientchsenbein, Mirielle Byrum, MD 03/15/18 [id number]    Blane OharaZavitz, Joshua, MD 03/16/18 [phone]

## 2018-03-15 MED ORDER — ONDANSETRON HCL 4 MG/2ML IJ SOLN: 4.0000 mg | Freq: Once | INTRAMUSCULAR | Status: DC

## 2018-03-15 MED ORDER — ONDANSETRON 4 MG PO TBDP
4.0000 mg | ORAL_TABLET | Freq: Once | ORAL | Status: AC
  Administered 2018-03-15: 4 mg via ORAL
  Filled 2018-03-15: qty 1

## 2018-03-16 ENCOUNTER — Telehealth: Payer: Self-pay | Admitting: Physician Assistant

## 2018-03-16 NOTE — Telephone Encounter (Signed)
Call pt recently seen in ED for CP. How is she? Is she going to need to follow up with cardiology?  Does she did to follow up on any other causes of CP with me?

## 2018-03-17 NOTE — Telephone Encounter (Signed)
Call went straight to VM. Left pt msg asking she call us with an update and encouraged her to schedule ED follow up with [facility]

## 2018-03-19 NOTE — Telephone Encounter (Signed)
Called patient and left a message for a return call.

## 2018-03-20 ENCOUNTER — Ambulatory Visit (INDEPENDENT_AMBULATORY_CARE_PROVIDER_SITE_OTHER): Payer: Self-pay | Admitting: Physician Assistant

## 2018-03-20 ENCOUNTER — Other Ambulatory Visit: Payer: Self-pay

## 2018-03-20 ENCOUNTER — Telehealth: Payer: Self-pay | Admitting: Physician Assistant

## 2018-03-20 ENCOUNTER — Encounter: Payer: Self-pay | Admitting: Physician Assistant

## 2018-03-20 VITALS — BP 110/72 | HR 69

## 2018-03-20 DIAGNOSIS — M5412 Radiculopathy, cervical region: Secondary | ICD-10-CM

## 2018-03-20 DIAGNOSIS — R002 Palpitations: Secondary | ICD-10-CM

## 2018-03-20 DIAGNOSIS — I498 Other specified cardiac arrhythmias: Secondary | ICD-10-CM

## 2018-03-20 DIAGNOSIS — I209 Angina pectoris, unspecified: Secondary | ICD-10-CM

## 2018-03-20 MED ORDER — CYCLOBENZAPRINE HCL 10 MG PO TABS: 10.0000 mg | ORAL_TABLET | Freq: Three times a day (TID) | ORAL | 0 refills | Status: DC | PRN

## 2018-03-20 MED ORDER — PREDNISONE 50 MG PO TABS: ORAL_TABLET | ORAL | 0 refills | Status: DC

## 2018-03-20 NOTE — Progress Notes (Signed)
Subjective:    Patient ID: Kelsey Dorsey, female    DOB: 10/03/1985, 33 y.o.   MRN: [id number]  HPI  Pt is a 33 yo female with hx of palpitations, atypical chest pains, dysthymia who presents to the clinic to follow up after ED visit on 03/14/2018 for CP. No reasons for CP seen on work up. She is seeing cardiology. They did a cardiac heart monitor but did not find anything last year. Switched to zebeta. She has not been seen by cardiology since 08/2017. She has not had any events since then as well. The concern has always been SVT that she could be going into but we have never been able to catch it.  She mentions that she is having some neck pain and radiation of pain/numbness/tingling down left arm into middle/ring/pinky finger. In ED when doctor placed pressure over neck it was painful. No known neck trauma.    Yesterday she had 45 minutes of elevated pulse rate, CP, weakness. Her mother had a nitroglycerin tablet but she did not take it.   .. Active Ambulatory Problems    Diagnosis Date Noted  . Sinus arrhythmia seen on electrocardiogram 11/10/2015  . Palpitations 04/15/2016  . Chest pain, atypical 04/15/2016  . Chest tightness 04/15/2016  . Migraine 11/08/2014  . Dysthymia 08/06/2017  . Anxiety 08/06/2017  . Trouble in sleeping 08/06/2017  . Cervical radiculopathy at C8 03/23/2018  . Angina pectoris (HCC) 03/23/2018   Resolved Ambulatory Problems    Diagnosis Date Noted  . No Resolved Ambulatory Problems   No Additional Past Medical History      Review of Systems    see hPI. Objective:   Physical Exam Vitals signs reviewed.  Constitutional:      Appearance: Normal appearance.  HENT:     Head: Normocephalic and atraumatic.  Cardiovascular:     Rate and Rhythm: Normal rate and regular rhythm.     Pulses: Normal pulses.     Heart sounds: Normal heart sounds.  Pulmonary:     Effort: Pulmonary effort is normal.     Breath sounds: Normal breath sounds.  Chest:     Chest  wall: No tenderness.  Musculoskeletal:     Comments: Some cervical spine tenderness at C7/8.  Normal ROM of shoulder. Tight upper left back muscles.   Neurological:     General: No focal deficit present.     Mental Status: She is alert and oriented to person, place, and time.  Psychiatric:        Mood and Affect: Mood normal.           Assessment & Plan:  Marland KitchenMarland KitchenDiagnoses and all orders for this visit:  Angina pectoris (HCC)  Cervical radiculopathy at C8 -     cyclobenzaprine (FLEXERIL) 10 MG tablet; Take 1 tablet (10 mg total) by mouth 3 (three) times daily as needed for muscle spasms. -     predniSONE (DELTASONE) 50 MG tablet; Take one tablet for 5 days.  Sinus arrhythmia seen on electrocardiogram  Palpitations   Concerned for her to take nitro tablet due to dropping BP and HR too low. I am concerned she could have some cervical radiculopathy that could be causing pain and left arm symptoms and maybe heighting anxiety. Given burst of prednisone and flexeril. Continue with ibuprofen as needed up to 3 times a day. If continues will get xray etc. Given exercises to start for neck. Use heat and ice/icy hot patches/tens until.   For now  stay on zebeta. Need to follow up with cardiologist. If she has event come to our office and will do a EKG whenever she has event. Concern if we order cardiac monitor she will not have another event. Would consider a stress test but will let cardiology make that call. Her troponin have always been negative.   [name].Spent 30 minutes with patient and greater than 50 percent of visit spent counseling patient regarding treatment plan.

## 2018-03-20 NOTE — Telephone Encounter (Signed)
Per PCP, Pt can walk in anytime during clinic hours for EKG to try and catch palpitations.

## 2018-03-20 NOTE — Patient Instructions (Signed)
Cervical Radiculopathy    Cervical radiculopathy means that a nerve in the neck is pinched or bruised. This can cause pain or loss of feeling (numbness) that runs from your neck to your arm and fingers.  Follow these instructions at home:  Managing pain  · Take over-the-counter and prescription medicines only as told by your doctor.  · If directed, put ice on the injured or painful area.  ? Put ice in a plastic bag.  ? Place a towel between your skin and the bag.  ? Leave the ice on for 20 minutes, 2-3 times per day.  · If ice does not help, you can try using heat. Take a warm shower or warm bath, or use a heat pack as told by your doctor.  · You may try a gentle neck and shoulder massage.  Activity  · Rest as needed. Follow instructions from your doctor about any activities to avoid.  · Do exercises as told by your doctor or physical therapist.  General instructions  · If you were given a soft collar, wear it as told by your doctor.  · Use a flat pillow when you sleep.  · Keep all follow-up visits as told by your doctor. This is important.  Contact a doctor if:  · Your condition does not improve with treatment.  Get help right away if:  · Your pain gets worse and is not controlled with medicine.  · You lose feeling or feel weak in your hand, arm, face, or leg.  · You have a fever.  · You have a stiff neck.  · You cannot control when you poop or pee (have incontinence).  · You have trouble with walking, balance, or talking.  This information is not intended to replace advice given to you by your health care provider. Make sure you discuss any questions you have with your health care provider.  Document Released: 12/13/2010 Document Revised: 06/01/2015 Document Reviewed: 02/17/2014  Elsevier Interactive Patient Education © 2019 [employer] Inc[employer]

## 2018-03-23 DIAGNOSIS — I209 Angina pectoris, unspecified: Secondary | ICD-10-CM | POA: Insufficient documentation

## 2018-03-23 DIAGNOSIS — M5412 Radiculopathy, cervical region: Secondary | ICD-10-CM | POA: Insufficient documentation

## 2018-03-29 ENCOUNTER — Other Ambulatory Visit: Payer: Self-pay | Admitting: Physician Assistant

## 2018-03-29 DIAGNOSIS — F419 Anxiety disorder, unspecified: Secondary | ICD-10-CM

## 2018-03-29 DIAGNOSIS — F341 Dysthymic disorder: Secondary | ICD-10-CM

## 2018-04-29 ENCOUNTER — Telehealth: Payer: Self-pay | Admitting: *Deleted

## 2018-04-29 NOTE — Telephone Encounter (Signed)
LMOM @ 12:11 pm, re: follow up appointment.

## 2018-05-27 ENCOUNTER — Ambulatory Visit (INDEPENDENT_AMBULATORY_CARE_PROVIDER_SITE_OTHER): Payer: Self-pay | Admitting: Family Medicine

## 2018-05-27 ENCOUNTER — Encounter: Payer: Self-pay | Admitting: Family Medicine

## 2018-05-27 VITALS — BP 123/81 | HR 70 | Temp 98.2°F | Ht 70.0 in | Wt 145.0 lb

## 2018-05-27 DIAGNOSIS — K529 Noninfective gastroenteritis and colitis, unspecified: Secondary | ICD-10-CM

## 2018-05-27 LAB — POCT URINALYSIS DIPSTICK
Bilirubin, UA: NEGATIVE
Blood, UA: NEGATIVE
Glucose, UA: NEGATIVE
Ketones, UA: NEGATIVE
Leukocytes, UA: NEGATIVE
Nitrite, UA: NEGATIVE
Protein, UA: NEGATIVE
Spec Grav, UA: 1.02 (ref 1.010–1.025)
Urobilinogen, UA: 0.2 E.U./dL
pH, UA: 5.5 (ref 5.0–8.0)

## 2018-05-27 LAB — POCT URINE PREGNANCY: Preg Test, Ur: NEGATIVE

## 2018-05-27 MED ORDER — PROMETHAZINE HCL 25 MG PO TABS: 25.0000 mg | ORAL_TABLET | Freq: Four times a day (QID) | ORAL | 2 refills | Status: DC | PRN

## 2018-05-27 NOTE — Patient Instructions (Signed)
Thank you for co Abdominal Pain, Adult Abdominal pain can be caused by many things. Often, abdominal pain is not serious and it gets better with no treatment or by being treated at home. However, sometimes abdominal pain is serious. Your health care provider will do a medical history and a physical exam to try to determine the cause of your abdominal pain. Follow these instructions at home:  Take over-the-counter and prescription medicines only as told by your health care provider. Do not take a laxative unless told by your health care provider.  Drink enough fluid to keep your urine clear or pale yellow.  Watch your condition for any changes.  Keep all follow-up visits as told by your health care provider. This is important. Contact a health care provider if:  Your abdominal pain changes or gets worse.  You are not hungry or you lose weight without trying.  You are constipated or have diarrhea for more than 2-3 days.  You have pain when you urinate or have a bowel movement.  Your abdominal pain wakes you up at night.  Your pain gets worse with meals, after eating, or with certain foods.  You are throwing up and cannot keep anything down.  You have a fever. Get help right away if:  Your pain does not go away as soon as your health care provider told you to expect.  You cannot stop throwing up.  Your pain is only in areas of the abdomen, such as the right side or the left lower portion of the abdomen.  You have bloody or black stools, or stools that look like tar.  You have severe pain, cramping, or bloating in your abdomen.  You have signs of dehydration, such as: ? Dark urine, very little urine, or no urine. ? Cracked lips. ? Dry mouth. ? Sunken eyes. ? Sleepiness. ? Weakness. This information is not intended to replace advice given to you by your health care provider. Make sure you discuss any questions you have with your health care provider. Document Released:  10/03/2004 Document Revised: 07/14/2015 Document Reviewed: 06/07/2015 Elsevier Interactive Patient Education  2019 [employer]. stool or black tarry stool go to the Emergency Room.  Take phenergan for vomiting.  Keep me updated.

## 2018-05-27 NOTE — Progress Notes (Signed)
Kelsey Dorsey is a 33 y.o. female [name] today for nausea vomiting and diarrhea.  This started Sunday associated with some intermittent abdominal pain.  She last vomited last night.  She is tried some Pepto Bismol which does help.  She is also tried some Tylenol which helps.  She denies any sick contacts at work and at home.  She denies any urinary symptoms.  She denies any vaginal bleeding or discharge.  She is not sure about her last menstrual period but has an IUD and does not have periods.  Notes her abdominal pain is typically located in the right lower quadrant.  Is usually about a 3 out of 10 last night it was as bad as 7 out of 10.  She notes a little bit of cough but otherwise feels well.  She denies any fevers or chills.  She did have some sweats last night.  Denies any history of abdominal surgery.  NV abd pain starting Sunday. Las tvomiit last night. No other sick contacts.      ROS as above:  Exam:  BP 123/81   Pulse 70   Temp 98.2 F (36.8 C) (Oral)   Ht 5\' 10"  (1.778 m)   Wt 145 lb (65.8 kg)   SpO2 98%   BMI 20.81 kg/m  Wt Readings from Last 5 Encounters:  05/27/18 145 lb (65.8 kg)  03/14/18 138 lb (62.6 kg)  09/02/17 135 lb 9.6 oz (61.5 kg)  09/01/17 135 lb (61.2 kg)  08/06/17 135 lb (61.2 kg)    Gen: Well NAD HEENT: EOMI,  MMM Lungs: Normal work of breathing. CTABL Heart: RRR no MRG Abd: NABS, Soft. Nondistended, mildly tender to palpation right lower quadrant without rebound or guarding.  No CVA angle tenderness to percussion Exts: Brisk capillary refill, warm and well perfused.   Lab and Radiology Results Results for orders placed or performed in visit on 05/27/18 (from the past 72 hour(s))  POCT urine pregnancy     Status: None   Collection Time: 05/27/18  1:37 PM  Result Value Ref Range   Preg Test, Ur  Negative Negative  POCT Urinalysis Dipstick     Status: None   Collection Time: 05/27/18  1:37 PM  Result Value Ref Range   Color, UA Yellow    Clarity, UA Clear    Glucose, UA Negative Negative   Bilirubin, UA Negative    Ketones, UA Negative    Spec Grav, UA 1.020 1.010 - 1.025   Blood, UA Negative    pH, UA 5.5 5.0 - 8.0   Protein, UA Negative Negative   Urobilinogen, UA 0.2 0.2 or 1.0 E.U./dL   Nitrite, UA Negative    Leukocytes, UA Negative Negative   Appearance     Odor     No results found.    Assessment and Plan: 33 y.o. female with abdominal pain associate with nausea vomiting and diarrhea.  Likely gastroenteritis.  Differential does include more serious etiology such as bacterial dysentery, appendicitis, pelvic inflammatory disease or ovarian torsion.  However these are all less likely.  COVID-19 is also unlikely. Plan to treat with Phenergan and Tylenol.  Obtain basic lab work-up including CBC and metabolic panel.  If leukocytosis present likely will proceed with CT scan of the abdomen and pelvis.  Additionally if worsening will obtain CT abdomen pelvis.  Would consider COVID-19 test in future also.  Patient will  check back tomorrow via my chart message for update. PDMP not reviewed this encounter. Orders Placed This Encounter  Procedures  . CBC with Differential/Platelet  . COMPLETE METABOLIC PANEL WITH GFR  . POCT urine pregnancy  . POCT Urinalysis Dipstick   Meds ordered this encounter  Medications  . promethazine (PHENERGAN) 25 MG tablet    Sig: Take 1 tablet (25 mg total) by mouth every 6 (six) hours as needed for nausea or vomiting.    Dispense:  30 tablet    Refill:  2     Historical information moved to improve visibility of documentation.  Past Medical History:  Diagnosis Date  . Palpitations    Past Surgical History:  Procedure Laterality Date  . KNEE SURGERY     Social History   Tobacco Use  . Smoking status: Never Smoker  . Smokeless  tobacco: Never Used  Substance Use Topics  . Alcohol use: Yes    Comment: Rare   family history includes Depression in her father; Diabetes in her mother; Heart attack in her mother; Hyperlipidemia in her mother; Hypertension in her mother.  Medications: Current Outpatient Medications  Medication Sig Dispense Refill  . bisoprolol (ZEBETA) 5 MG tablet Take 0.5 tablets (2.5 mg total) by mouth daily. 45 tablet 1  . cyclobenzaprine (FLEXERIL) 10 MG tablet Take 1 tablet (10 mg total) by mouth 3 (three) times daily as needed for muscle spasms. 30 tablet 0  . escitalopram (LEXAPRO) 10 MG tablet Take 1 tablet (10 mg total) by mouth daily. 90 tablet 0  . LORazepam (ATIVAN) 0.5 MG tablet Take 1 tablet (0.5 mg total) by mouth 2 (two) times daily as needed for anxiety. Appointment MUST be made for refills. 30 tablet 0  . naproxen (NAPROSYN) 500 MG tablet Take 1 tablet (500 mg total) by mouth 2 (two) times daily. 30 tablet 0  . predniSONE (DELTASONE) 50 MG tablet Take one tablet for 5 days. 5 tablet 0  . Levonorgestrel (KYLEENA) 19.5 MG IUD 1 Device by Intrauterine route once for 1 dose. Inserted 01/20/2017 by Dr. Lyn Hollingshead. 1 Intra Uterine Device 0  . promethazine (PHENERGAN) 25 MG tablet Take 1 tablet (25 mg total) by mouth every 6 (six) hours as needed for nausea or vomiting. 30 tablet 2   No current facility-administered medications for this visit.    Allergies  Allergen Reactions  . Codeine Itching  . Cephalexin Other (See Comments)  . Morphine And Related Other (See Comments)     Discussed warning signs or symptoms. Please see discharge instructions. Patient expresses understanding.

## 2018-05-28 LAB — CBC WITH DIFFERENTIAL/PLATELET
Absolute Monocytes: 518 cells/uL (ref 200–950)
Basophils Absolute: 38 cells/uL (ref 0–200)
Basophils Relative: 0.4 %
Eosinophils Absolute: 58 cells/uL (ref 15–500)
Eosinophils Relative: 0.6 %
HCT: 42 % (ref 35.0–45.0)
Hemoglobin: 14.2 g/dL (ref 11.7–15.5)
Lymphs Abs: 2534 cells/uL (ref 850–3900)
MCH: 31.4 pg (ref 27.0–33.0)
MCHC: 33.8 g/dL (ref 32.0–36.0)
MCV: 92.9 fL (ref 80.0–100.0)
MPV: 10 fL (ref 7.5–12.5)
Monocytes Relative: 5.4 %
Neutro Abs: 6451 cells/uL (ref 1500–7800)
Neutrophils Relative %: 67.2 %
Platelets: 269 10*3/uL (ref 140–400)
RBC: 4.52 10*6/uL (ref 3.80–5.10)
RDW: 11.9 % (ref 11.0–15.0)
Total Lymphocyte: 26.4 %
WBC: 9.6 10*3/uL (ref 3.8–10.8)

## 2018-05-28 LAB — COMPLETE METABOLIC PANEL WITH GFR
AG Ratio: 1.5 (calc) (ref 1.0–2.5)
ALT: 23 U/L (ref 6–29)
AST: 18 U/L (ref 10–30)
Albumin: 4.5 g/dL (ref 3.6–5.1)
Alkaline phosphatase (APISO): 52 U/L (ref 31–125)
BUN: 12 mg/dL (ref 7–25)
CO2: 22 mmol/L (ref 20–32)
Calcium: 9.7 mg/dL (ref 8.6–10.2)
Chloride: 106 mmol/L (ref 98–110)
Creat: 0.75 mg/dL (ref 0.50–1.10)
GFR, Est African American: 120 mL/min/{1.73_m2} (ref >=60)
GFR, Est Non African American: 103 mL/min/{1.73_m2} (ref >=60)
Globulin: 3.1 g/dL (calc) (ref 1.9–3.7)
Glucose, Bld: 94 mg/dL (ref 65–99)
Potassium: 4.5 mmol/L (ref 3.5–5.3)
Sodium: 138 mmol/L (ref 135–146)
Total Bilirubin: 0.6 mg/dL (ref 0.2–1.2)
Total Protein: 7.6 g/dL (ref 6.1–8.1)

## 2018-06-25 ENCOUNTER — Other Ambulatory Visit: Payer: Self-pay | Admitting: Physician Assistant

## 2018-06-25 DIAGNOSIS — F341 Dysthymic disorder: Secondary | ICD-10-CM

## 2018-06-25 DIAGNOSIS — F419 Anxiety disorder, unspecified: Secondary | ICD-10-CM

## 2018-07-02 ENCOUNTER — Other Ambulatory Visit: Payer: Self-pay | Admitting: Physician Assistant

## 2018-07-02 ENCOUNTER — Encounter: Payer: Self-pay | Admitting: Physician Assistant

## 2018-07-02 NOTE — Telephone Encounter (Signed)
Spoke with pt, the low heart rate started while she was asleep according to her apple watch and it lasted to about 9:30 am, she got up at 6 :30 am. She reports feeling "out of it", denies dizziness or faintness. Her average hear rate is usually above 90 and her heart rate has not even really gone above 60-70 even with exertion. She sent a message to her medical doctor and she did not take her bisoprolol this morning and contact us. Okay given for patient to stay off the bisoprolol for now. She continue to monitor and Patient voiced understanding to get medical attention if she passes out. Will forward to dr Stanford Breed to review

## 2018-07-02 NOTE — Progress Notes (Signed)
zeb

## 2018-07-29 ENCOUNTER — Other Ambulatory Visit: Payer: Self-pay | Admitting: Physician Assistant

## 2018-07-29 DIAGNOSIS — F419 Anxiety disorder, unspecified: Secondary | ICD-10-CM

## 2018-07-29 DIAGNOSIS — F341 Dysthymic disorder: Secondary | ICD-10-CM

## 2018-08-02 ENCOUNTER — Other Ambulatory Visit: Payer: Self-pay | Admitting: Physician Assistant

## 2018-08-02 DIAGNOSIS — F419 Anxiety disorder, unspecified: Secondary | ICD-10-CM

## 2018-08-02 DIAGNOSIS — F341 Dysthymic disorder: Secondary | ICD-10-CM

## 2018-08-16 ENCOUNTER — Other Ambulatory Visit: Payer: Self-pay | Admitting: Physician Assistant

## 2018-08-16 DIAGNOSIS — F341 Dysthymic disorder: Secondary | ICD-10-CM

## 2018-08-16 DIAGNOSIS — F419 Anxiety disorder, unspecified: Secondary | ICD-10-CM

## 2018-08-25 ENCOUNTER — Ambulatory Visit (INDEPENDENT_AMBULATORY_CARE_PROVIDER_SITE_OTHER): Payer: Self-pay | Admitting: Physician Assistant

## 2018-08-25 VITALS — HR 97 | Temp 96.6°F | Ht 70.0 in | Wt 139.0 lb

## 2018-08-25 DIAGNOSIS — I498 Other specified cardiac arrhythmias: Secondary | ICD-10-CM

## 2018-08-25 DIAGNOSIS — R002 Palpitations: Secondary | ICD-10-CM

## 2018-08-25 DIAGNOSIS — F419 Anxiety disorder, unspecified: Secondary | ICD-10-CM

## 2018-08-25 DIAGNOSIS — F341 Dysthymic disorder: Secondary | ICD-10-CM

## 2018-08-25 MED ORDER — BISOPROLOL FUMARATE 5 MG PO TABS: 2.5000 mg | ORAL_TABLET | Freq: Every day | ORAL | 1 refills | Status: DC

## 2018-08-25 MED ORDER — LORAZEPAM 0.5 MG PO TABS: 0.5000 mg | ORAL_TABLET | Freq: Two times a day (BID) | ORAL | 2 refills | Status: DC | PRN

## 2018-08-25 MED ORDER — ESCITALOPRAM OXALATE 10 MG PO TABS: 10.0000 mg | ORAL_TABLET | Freq: Every day | ORAL | 3 refills | Status: DC

## 2018-08-25 NOTE — Progress Notes (Signed)
F/u for med refills. PHQ9-GAD7 completed.  She also states heart condition "acting up". A few days ago she was sitting watching TV and HR 140 on apple watch. Felt like she was "punched in the chest". Took half a bisoprolol and went down. Sitting now it is 97.

## 2018-08-25 NOTE — Progress Notes (Signed)
Patient ID: Kelsey Dorsey, female   DOB: 07/09/1985, 33 y.o.   MRN: [id number] .[name].Virtual Visit via Video Note  I connected with Kelsey Dorsey on 08/28/18 at 10:50 AM EDT by a video enabled telemedicine application and verified that I am speaking with the correct person using two identifiers.  Location: Patient: home Provider: clinic   I discussed the limitations of evaluation and management by telemedicine and the availability of in person appointments. The patient expressed understanding and agreed to proceed.  History of Present Illness: Pt is a 33 yo female with hx of palpitations and anxiety who calls into the clinic for refills. Her anxiety is doing well. lexapro has worked [employer]. No problems or concerns.   She continues to have episodes of increased HR up tot 140's and palpitations. She has an extensive fam hx of arrhthymias/CAD/MI. She has been evaluated by cardiology multiple times. She was worn a 30 day heart monitor. She denies any caffiene/chocolate use or trigger. Most of the times episodes happen when she is just sitting or laying down. They can last a few minutes or up to 30 minutes. She has been having them once a month for the last 6 months but have a few more recently. She was told to stop BB but when she has episodes she takes 1/2 of one and then it usually resolves. Last summer on 1/2 tablet her pulse was really low on it but no other side effects.   .. Active Ambulatory Problems    Diagnosis Date Noted  . Sinus arrhythmia seen on electrocardiogram 11/10/2015  . Palpitations 04/15/2016  . Chest pain, atypical 04/15/2016  . Chest tightness 04/15/2016  . Migraine 11/08/2014  . Dysthymia 08/06/2017  . Anxiety 08/06/2017  . Trouble in sleeping 08/06/2017  . Cervical radiculopathy at C8 03/23/2018  . Angina pectoris (HCC) 03/23/2018   Resolved Ambulatory Problems    Diagnosis Date Noted  . No Resolved Ambulatory Problems   No Additional Past Medical History   Reviewed  med, allergies, problem list.    Observations/Objective: No acute distress.  No labored breathing.  Normal mood.   .. Today's Vitals   08/25/18 1048  Pulse: 97  Temp: (!) 96.6 F (35.9 C)  TempSrc: Oral  Weight: 139 lb (63 kg)  Height: 5\' 10"  (1.778 m)   Body mass index is 19.94 kg/m.  .. [facility] 08/25/2018 08/06/2017 10/30/2016 07/18/2016  Decreased Interest 1 1 0 0  Down, Depressed, Hopeless 0 - 0 0  PHQ - 2 Score 1 1 0 0  Altered sleeping 1 2 - 1  Tired, decreased energy 3 2 - 0  Change in appetite 3 3 - 0  Feeling bad or failure about yourself  0 2 - 0  Trouble concentrating 1 1 - 0  Moving slowly or fidgety/restless 0 0 - 0  Suicidal thoughts 0 0 - 0  PHQ-9 Score 9 11 - 1  Difficult doing work/chores Not difficult at all Somewhat difficult - -   .[name]. GAD 7 : Generalized Anxiety Score 08/25/2018 08/06/2017 07/18/2016  Nervous, Anxious, on Edge 3 2 0  Control/stop worrying 3 2 0  Worry too much - different things 3 2 0  Trouble relaxing 1 2 0  Restless 1 1 0  Easily annoyed or irritable 0 2 0  Afraid - awful might happen 1 0 0  Total GAD 7 Score 12 11 0  Anxiety Difficulty Not difficult at all Somewhat difficult Not difficult at  all       Assessment and Plan: Marland KitchenMarland KitchenDiagnoses and all orders for this visit:  Anxiety -     escitalopram (LEXAPRO) 10 MG tablet; Take 1 tablet (10 mg total) by mouth daily. -     LORazepam (ATIVAN) 0.5 MG tablet; Take 1 tablet (0.5 mg total) by mouth 2 (two) times daily as needed for anxiety.  Dysthymia -     escitalopram (LEXAPRO) 10 MG tablet; Take 1 tablet (10 mg total) by mouth daily.  Palpitations -     bisoprolol (ZEBETA) 5 MG tablet; Take 0.5 tablets (2.5 mg total) by mouth daily.  Sinus arrhythmia seen on electrocardiogram   Refilled lexapro for 1 year.   Cardiology has not been able to catch her episodes to give her dx. Told her if she were to have episode get someone to drive her here for EKG.   If can  break bisoprolol in fourths she could try fourth daily. If not could go back on half to see if episodes were to stop. Watch HR if dropping below 60 need to call office.   Follow up in 3 months.   Follow Up Instructions:    I discussed the assessment and treatment plan with the patient. The patient was provided an opportunity to ask questions and all were answered. The patient agreed with the plan and demonstrated an understanding of the instructions.   The patient was advised to call back or seek an in-person evaluation if the symptoms worsen or if the condition fails to improve as anticipated.    Iran Planas, PA-C

## 2018-08-28 ENCOUNTER — Encounter: Payer: Self-pay | Admitting: Physician Assistant

## 2018-09-04 ENCOUNTER — Telehealth: Payer: Self-pay | Admitting: Physician Assistant

## 2018-09-04 DIAGNOSIS — R002 Palpitations: Secondary | ICD-10-CM

## 2018-09-04 DIAGNOSIS — R001 Bradycardia, unspecified: Secondary | ICD-10-CM

## 2018-09-04 DIAGNOSIS — R000 Tachycardia, unspecified: Secondary | ICD-10-CM

## 2018-09-04 NOTE — Telephone Encounter (Signed)
Call patient. Her mom was in this morning and telling me about HR low and then spikes high. I want to refer you to another cardiologist. I really think you need a long term heart monitor to get to the bottom of this.   Referral has been placed.

## 2018-09-04 NOTE — Telephone Encounter (Signed)
Left message on machine for patient to call back.

## 2018-09-07 NOTE — Telephone Encounter (Signed)
Tried to call patient again with no answer 

## 2018-09-08 NOTE — Telephone Encounter (Signed)
Patient called back and was made aware.

## 2018-10-18 NOTE — Progress Notes (Addendum)
Cardiology Office Note   Date:  10/19/2018   ID:  Kelsey Dorsey, DOB 10-31-1985, MRN [id number]  PCP:  Jomarie Longs, PA-C  Cardiologist: Dr. Jens Som CC: Chest Pain    History of Present Illness: Kelsey Dorsey is a 33 y.o. female who presents for ongoing assessment and management of palpitation, suspected PSVT, with other history to include anxiety and depression.  She is being followed by PCP for mood disorder with medications added to include Lexapro.    She was last seen in the office by Corine Shelter, PA, and August 2019 complaining of chest pain.  The pain was found to be pleuritic and movement exacerbated.  [name] complained of rapid heart rhythm and her mother, who is with her, concurred that she actually saw the heart rate beat fast with in her chest.  She was started on bisoprolol 2.5 mg daily, she was to continue NSAIDs for chronic chest pain.  A 30-day cardiac monitor was placed with consideration to do an echocardiogram on follow-up.  Cardiac monitor dated 09/03/2017 was reviewed by Dr. Jens Som revealing no significant arrhythmia other than PACs and PVCs.  No change in medication regimen was recommended.  She comes today tearful stating that she was awakened last night by sharp chest discomfort in the left pectoral area radiating up into the shoulder.  She states it has been ongoing for several hours waxing and waning.  She has no associated diaphoresis, dizziness, she does have some discomfort with taking deep breaths on occasion.  No coughing.  She is frankly very tired of the discomfort and wants to get to the bottom of it.  The patient stopped taking bisoprolol as it was causing her heart rate to be too low she was taken off of this in June 2020.  Past Medical History:  Diagnosis Date  . Palpitations     Past Surgical History:  Procedure Laterality Date  . KNEE SURGERY       Current Outpatient Medications  Medication Sig Dispense Refill  . bisoprolol (ZEBETA) 5 MG  tablet Take 0.5 tablets (2.5 mg total) by mouth daily. 45 tablet 1  . cyclobenzaprine (FLEXERIL) 10 MG tablet Take 1 tablet (10 mg total) by mouth 3 (three) times daily as needed for muscle spasms. 30 tablet 0  . escitalopram (LEXAPRO) 10 MG tablet Take 1 tablet (10 mg total) by mouth daily. 90 tablet 3  . LORazepam (ATIVAN) 0.5 MG tablet Take 1 tablet (0.5 mg total) by mouth 2 (two) times daily as needed for anxiety. 30 tablet 2  . naproxen (NAPROSYN) 500 MG tablet Take 1 tablet (500 mg total) by mouth 2 (two) times daily. 30 tablet 0  . Levonorgestrel (KYLEENA) 19.5 MG IUD 1 Device by Intrauterine route once for 1 dose. Inserted 01/20/2017 by Dr. Lyn Hollingshead. 1 Intra Uterine Device 0  . omeprazole (PRILOSEC) 20 MG capsule Take 1 capsule (20 mg total) by mouth daily. 30 capsule 6  . traMADol (ULTRAM) 50 MG tablet Take 1 tablet (50 mg total) by mouth every 6 (six) hours as needed. 30 tablet 1   No current facility-administered medications for this visit.     Allergies:   Codeine, Cephalexin, and Morphine and related    Social History:  The patient  reports that she has never smoked. She has never used smokeless tobacco. She reports current alcohol use. She reports that she does not use drugs.   Family History:  The patient's family history includes Depression in her father;  Diabetes in her mother; Heart attack in her mother; Hyperlipidemia in her mother; Hypertension in her mother.    ROS: All other systems are reviewed and negative. Unless otherwise mentioned in H&P    PHYSICAL EXAM: VS:  BP 100/72   Pulse 71   Ht 5\' 10"  (1.778 m)   Wt 138 lb 9.6 oz (62.9 kg)   BMI 19.89 kg/m  , BMI Body mass index is 19.89 kg/m. GEN: Well nourished, well developed, in no acute distress HEENT: normal Neck: no JVD, carotid bruits, or masses Cardiac: RRR; no murmurs, rubs, or gallops,no edema  Respiratory:  Clear to auscultation bilaterally, normal work of breathing GI: soft, nontender, nondistended,  + BS MS: no deformity or atrophy Skin: warm and dry, no rash Neuro:  Strength and sensation are intact Psych: euthymic mood, full affect   EKG: Normal sinus rhythm with sinus arrhythmia, incomplete right bundle branch block.  Heart rate of 71 bpm.  Recent Labs: 05/27/2018: ALT 23; BUN 12; Creat 0.75; Hemoglobin 14.2; Platelets 269; Potassium 4.5; Sodium 138    Lipid Panel    Component Value Date/Time   CHOL 151 12/19/2016 1401   TRIG 68 12/19/2016 1401   HDL 91 12/19/2016 1401   CHOLHDL 1.7 12/19/2016 1401   VLDL 20 11/10/2015 1123   LDLCALC 46 12/19/2016 1401      Wt Readings from Last 3 Encounters:  10/19/18 138 lb 9.6 oz (62.9 kg)  08/25/18 139 lb (63 kg)  05/27/18 145 lb (65.8 kg)      Other studies Reviewed: Cardiac Monitor 09/03/2017 Sinus bradycardia, normal sinus rhythm, sinus tachycardia, occasional PACs and PVCs Kirk Ruths, MD  Echocardiogram 05/01/2016 Left ventricle: The cavity size was normal. Wall thickness was   normal. Systolic function was normal. The estimated ejection   fraction was in the range of 55% to 60%. Wall motion was normal;   there were no regional wall motion abnormalities. Left   ventricular diastolic function parameters were normal. - Mitral valve: Mild prolapse, involving the anterior leaflet.   ASSESSMENT AND PLAN:  1.  Chest pain: Recurrent, chronic, described as sharp, awaking her at night, with waxing and waning.  Cardiac monitor did not reveal significant arrhythmias, bradycardia, or rapid heart rates.  Bisoprolol was discontinued due to bradycardia in June 2020.  She is hypotensive today but denies any symptoms associated with this.  She is quite anxious concerning the recurrence of her chest pain.  I believe it is more musculoskeletal or GI in etiology.  I am starting her on pantoprazole 20 mg in the afternoon, and may increase to 20 mg twice daily.  To give her reassurance concerning her heart, I will do a GXT stress test to  evaluate for her heart rate response to exercise and for changes in EKG.  I will give her tramadol 50 mg to take twice daily.  Further refills should come to PCP if it is effective for her.  Would also consider GI work-up to include evaluation of gallbladder for biliary dyskinesia or cholecystitis.  Last review of labs did not show elevated LFTs.  2.  Depression and anxiety: Follow-up with PCP.  May need to consider counseling at their discretion.   Current medicines are reviewed at length with the patient today.    Labs/ tests ordered today include: GXT  Phill Myron. West Pugh, ANP, AACC   10/19/2018 11:00 AM    [address] Office [phone] Fax [phone]  Notice: This dictation was prepared with Dragon dictation along with smaller phrase technology. Any transcriptional errors that result from this process are unintentional and may not be corrected upon review.

## 2018-10-19 ENCOUNTER — Encounter: Payer: Self-pay | Admitting: Adult Health

## 2018-10-19 ENCOUNTER — Other Ambulatory Visit: Payer: Self-pay

## 2018-10-19 ENCOUNTER — Ambulatory Visit (INDEPENDENT_AMBULATORY_CARE_PROVIDER_SITE_OTHER): Payer: Self-pay | Admitting: Adult Health

## 2018-10-19 VITALS — BP 100/72 | HR 71 | Ht 70.0 in | Wt 138.6 lb

## 2018-10-19 DIAGNOSIS — R079 Chest pain, unspecified: Secondary | ICD-10-CM

## 2018-10-19 DIAGNOSIS — F419 Anxiety disorder, unspecified: Secondary | ICD-10-CM

## 2018-10-19 DIAGNOSIS — G8929 Other chronic pain: Secondary | ICD-10-CM

## 2018-10-19 DIAGNOSIS — M7918 Myalgia, other site: Secondary | ICD-10-CM

## 2018-10-19 MED ORDER — TRAMADOL HCL 50 MG PO TABS: 50.0000 mg | ORAL_TABLET | Freq: Four times a day (QID) | ORAL | 1 refills | Status: DC | PRN

## 2018-10-19 MED ORDER — OMEPRAZOLE 20 MG PO CPDR: 20.0000 mg | DELAYED_RELEASE_CAPSULE | Freq: Every day | ORAL | 6 refills | Status: DC

## 2018-10-19 NOTE — Patient Instructions (Signed)
Medication Instructions:  START- Omeprazole 20 mg by mouth daily  If you need a refill on your cardiac medications before your next appointment, please call your pharmacy.  Labwork: None Ordered   Testing/Procedures: Your physician has requested that you have an exercise tolerance test. For further information please visit [email or web address]. Please also follow instruction sheet, as given.  Follow-Up: . Your physician recommends that you schedule a follow-up appointment in: [place], you and your health needs are our priority.  As part of our continuing mission to provide you with exceptional heart care, we have created designated Provider Care Teams.  These Care Teams include your primary Cardiologist (physician) and Advanced Practice Providers (APPs -  Physician Assistants and Nurse Practitioners) who all work together to provide you with the care you need, when you need it.  Thank you for choosing [facility] at [facility]!!

## 2018-11-05 ENCOUNTER — Telehealth (HOSPITAL_COMMUNITY): Payer: Self-pay | Admitting: *Deleted

## 2018-11-05 NOTE — Telephone Encounter (Signed)
Close encounter 

## 2018-11-06 ENCOUNTER — Other Ambulatory Visit (HOSPITAL_COMMUNITY)
Admission: RE | Admit: 2018-11-06 | Discharge: 2018-11-06 | Disposition: A | Discharge status: Home / Self Care | Payer: Self-pay | Source: Ambulatory Visit | Attending: Adult Health | Admitting: Adult Health

## 2018-11-06 ENCOUNTER — Telehealth (HOSPITAL_COMMUNITY): Payer: Self-pay | Admitting: *Deleted

## 2018-11-06 DIAGNOSIS — Z01812 Encounter for preprocedural laboratory examination: Secondary | ICD-10-CM | POA: Insufficient documentation

## 2018-11-06 DIAGNOSIS — Z20828 Contact with and (suspected) exposure to other viral communicable diseases: Secondary | ICD-10-CM | POA: Insufficient documentation

## 2018-11-06 NOTE — Telephone Encounter (Signed)
Close encounter 

## 2018-11-09 LAB — NOVEL CORONAVIRUS, NAA (HOSP ORDER, SEND-OUT TO REF LAB; TAT 18-24 HRS): SARS-CoV-2, NAA: NOT DETECTED

## 2018-11-10 ENCOUNTER — Other Ambulatory Visit: Payer: Self-pay

## 2018-11-10 ENCOUNTER — Ambulatory Visit (HOSPITAL_COMMUNITY)
Admission: RE | Admit: 2018-11-10 | Discharge: 2018-11-10 | Disposition: A | Discharge status: Home / Self Care | Payer: Self-pay | Source: Ambulatory Visit | Attending: Cardiology | Admitting: Cardiology

## 2018-11-10 DIAGNOSIS — R079 Chest pain, unspecified: Secondary | ICD-10-CM | POA: Insufficient documentation

## 2018-11-10 LAB — EXERCISE TOLERANCE TEST
Estimated workload: 13 METS
Exercise duration (min): 10 min
Exercise duration (sec): 48 s
MPHR: 185 {beats}/min
Peak HR: 160 {beats}/min
Percent HR: 86 %
RPE: 16
Rest HR: 44 {beats}/min

## 2018-12-02 ENCOUNTER — Ambulatory Visit: Payer: Self-pay | Admitting: Adult Health

## 2019-03-04 ENCOUNTER — Emergency Department (HOSPITAL_BASED_OUTPATIENT_CLINIC_OR_DEPARTMENT_OTHER): Payer: Self-pay

## 2019-03-04 ENCOUNTER — Emergency Department (HOSPITAL_COMMUNITY): Payer: Self-pay

## 2019-03-04 ENCOUNTER — Other Ambulatory Visit: Payer: Self-pay

## 2019-03-04 ENCOUNTER — Emergency Department (HOSPITAL_BASED_OUTPATIENT_CLINIC_OR_DEPARTMENT_OTHER)
Admission: EM | Admit: 2019-03-04 | Discharge: 2019-03-04 | Disposition: A | Discharge status: Home / Self Care | Payer: Self-pay | Attending: Emergency Medicine | Admitting: Emergency Medicine

## 2019-03-04 ENCOUNTER — Encounter (HOSPITAL_BASED_OUTPATIENT_CLINIC_OR_DEPARTMENT_OTHER): Payer: Self-pay | Admitting: Emergency Medicine

## 2019-03-04 ENCOUNTER — Telehealth: Payer: Self-pay | Admitting: Physician Assistant

## 2019-03-04 DIAGNOSIS — R519 Headache, unspecified: Secondary | ICD-10-CM | POA: Insufficient documentation

## 2019-03-04 DIAGNOSIS — Z79899 Other long term (current) drug therapy: Secondary | ICD-10-CM | POA: Insufficient documentation

## 2019-03-04 DIAGNOSIS — R200 Anesthesia of skin: Secondary | ICD-10-CM | POA: Insufficient documentation

## 2019-03-04 DIAGNOSIS — Z20822 Contact with and (suspected) exposure to covid-19: Secondary | ICD-10-CM | POA: Insufficient documentation

## 2019-03-04 DIAGNOSIS — R4781 Slurred speech: Secondary | ICD-10-CM | POA: Insufficient documentation

## 2019-03-04 LAB — CBC WITH DIFFERENTIAL/PLATELET
Abs Immature Granulocytes: 0.03 10*3/uL (ref 0.00–0.07)
Basophils Absolute: 0 10*3/uL (ref 0.0–0.1)
Basophils Relative: 0 %
Eosinophils Absolute: 0 10*3/uL (ref 0.0–0.5)
Eosinophils Relative: 0 %
HCT: 43.7 % (ref 36.0–46.0)
Hemoglobin: 14.4 g/dL (ref 12.0–15.0)
Immature Granulocytes: 0 %
Lymphocytes Relative: 26 %
Lymphs Abs: 2.2 10*3/uL (ref 0.7–4.0)
MCH: 31.5 pg (ref 26.0–34.0)
MCHC: 33 g/dL (ref 30.0–36.0)
MCV: 95.6 fL (ref 80.0–100.0)
Monocytes Absolute: 0.5 10*3/uL (ref 0.1–1.0)
Monocytes Relative: 5 %
Neutro Abs: 5.9 10*3/uL (ref 1.7–7.7)
Neutrophils Relative %: 69 %
Platelets: 278 10*3/uL (ref 150–400)
RBC: 4.57 MIL/uL (ref 3.87–5.11)
RDW: 12.7 % (ref 11.5–15.5)
WBC: 8.7 10*3/uL (ref 4.0–10.5)
nRBC: 0 % (ref 0.0–0.2)

## 2019-03-04 LAB — COMPREHENSIVE METABOLIC PANEL
ALT: 18 U/L (ref 0–44)
AST: 19 U/L (ref 15–41)
Albumin: 4.5 g/dL (ref 3.5–5.0)
Alkaline Phosphatase: 54 U/L (ref 38–126)
Anion gap: 8 (ref 5–15)
BUN: 10 mg/dL (ref 6–20)
CO2: 28 mmol/L (ref 22–32)
Calcium: 9.3 mg/dL (ref 8.9–10.3)
Chloride: 99 mmol/L (ref 98–111)
Creatinine, Ser: 0.79 mg/dL (ref 0.44–1.00)
GFR calc Af Amer: >60 mL/min (ref >60)
GFR calc non Af Amer: >60 mL/min (ref >60)
Glucose, Bld: 129 mg/dL — ABNORMAL HIGH (ref 70–99)
Potassium: 3.4 mmol/L — ABNORMAL LOW (ref 3.5–5.1)
Sodium: 135 mmol/L (ref 135–145)
Total Bilirubin: 0.6 mg/dL (ref 0.3–1.2)
Total Protein: 7.7 g/dL (ref 6.5–8.1)

## 2019-03-04 LAB — RESPIRATORY PANEL BY RT PCR (FLU A&B, COVID)
Influenza A by PCR: NEGATIVE
Influenza B by PCR: NEGATIVE
SARS Coronavirus 2 by RT PCR: NEGATIVE

## 2019-03-04 LAB — MAGNESIUM: Magnesium: 1.7 mg/dL (ref 1.7–2.4)

## 2019-03-04 LAB — PREGNANCY, URINE: Preg Test, Ur: NEGATIVE

## 2019-03-04 MED ORDER — DIPHENHYDRAMINE HCL 25 MG PO CAPS
25.0000 mg | ORAL_CAPSULE | Freq: Once | ORAL | Status: AC
  Administered 2019-03-04: 25 mg via ORAL

## 2019-03-04 MED ORDER — ACETAMINOPHEN 325 MG PO TABS
650.0000 mg | ORAL_TABLET | Freq: Once | ORAL | Status: AC
  Administered 2019-03-04: 650 mg via ORAL
  Filled 2019-03-04: qty 2

## 2019-03-04 MED ORDER — DIPHENHYDRAMINE HCL 25 MG PO CAPS
25.0000 mg | ORAL_CAPSULE | Freq: Once | ORAL | Status: DC
  Filled 2019-03-04: qty 1

## 2019-03-04 MED ORDER — PROCHLORPERAZINE EDISYLATE 10 MG/2ML IJ SOLN
10.0000 mg | Freq: Once | INTRAMUSCULAR | Status: AC
  Administered 2019-03-04: 10 mg via INTRAVENOUS

## 2019-03-04 MED ORDER — GADOBUTROL 1 MMOL/ML IV SOLN
6.0000 mL | Freq: Once | INTRAVENOUS | Status: AC | PRN
  Administered 2019-03-04: 6 mL via INTRAVENOUS

## 2019-03-04 MED ORDER — SODIUM CHLORIDE 0.9 % IV BOLUS
1000.0000 mL | Freq: Once | INTRAVENOUS | Status: AC
  Administered 2019-03-04: 1000 mL via INTRAVENOUS

## 2019-03-04 MED ORDER — POTASSIUM CHLORIDE CRYS ER 20 MEQ PO TBCR
40.0000 meq | EXTENDED_RELEASE_TABLET | Freq: Once | ORAL | Status: AC
  Administered 2019-03-04: 40 meq via ORAL
  Filled 2019-03-04: qty 2

## 2019-03-04 MED ORDER — PROCHLORPERAZINE EDISYLATE 10 MG/2ML IJ SOLN
10.0000 mg | Freq: Once | INTRAMUSCULAR | Status: DC
  Filled 2019-03-04: qty 2

## 2019-03-04 NOTE — ED Notes (Signed)
Pt more calm, agrees with MD for need to transfer to [facility] for MRI.  Call to [place], RN charge to provide report.  Pt to transfer via private vehicle per Dr Criss Alvine.  Pt alert, oriented, no change in symptoms since arrival.  Pt and mom aware of need to transport directly to [facility] ER without stops and check in to ER.

## 2019-03-04 NOTE — Telephone Encounter (Signed)
Patient calls and states she woke up this morning and the left side of her face is numb and tingling, has some congestion.  No visual face drooping per her mother, no swelling, no cough.  Advised to send to schedule an appointment for today with one of our NP's. Sent to schedule appointment. KG LPN

## 2019-03-04 NOTE — ED Notes (Signed)
Pt restless, crying, asking if she can just be dishcarged home, not sent to [facility] for MRI, MD made aware and in to speak to patient.

## 2019-03-04 NOTE — ED Notes (Signed)
Discharge instructions reviewed with pt. Pt verbalized understanding.   

## 2019-03-04 NOTE — Progress Notes (Signed)
Due to very recent contrast enhanced MR study. Exam will be in slight delay per Dr. Allena Katz to allow possible venous contamination, w/ regard to imaging, to relax. This will make Angiographic imaging more optimal. Message sent to PA through secure chat. Exam TBD 2200

## 2019-03-04 NOTE — Discharge Instructions (Addendum)
Your imaging today has been reassuring.  We recommend that you follow-up with neurology for further evaluation of any persistent/ongoing symptoms.  It is possible that your numbness may be related to a complicated migraine.  You may try over-the-counter Excedrin Migraine for any symptoms in the future.  Continue follow-up with a primary care doctor.  Return to the ED for new or concerning symptoms.

## 2019-03-04 NOTE — ED Provider Notes (Signed)
[facility] EMERGENCY DEPARTMENT Provider Note   CSN: [id number] Arrival date & time: 03/04/19  1333     History Chief Complaint  Patient presents with   Numbness    Kelsey Dorsey is a 34 y.o. female.  Patient around 830 noticed some left sided numbness in her left hand, left thigh, left side of her face possibly associated with slurred speech.  Now has developed a mild headache about an hour prior to arrival.  Denies any neck pain, back pain, no head trauma.  History of headaches as a child.  Overall no infectious symptoms.  Has very mild headache now.  No vision changes.  No numbness, no nausea.  No history of recent travel or recent illness.  No rash.  No history of neurological diseases in the family.  The history is provided by the patient.  Neurologic Problem This is a new problem. The current episode started 6 to 12 hours ago. The problem occurs constantly. Associated symptoms include headaches. Pertinent negatives include no chest pain, no abdominal pain and no shortness of breath. Nothing aggravates the symptoms. Nothing relieves the symptoms. She has tried nothing for the symptoms. The treatment provided no relief.       Past Medical History:  Diagnosis Date   Palpitations     Patient Active Problem List   Diagnosis Date Noted   Cervical radiculopathy at C8 03/23/2018   Angina pectoris (HCC) 03/23/2018   Dysthymia 08/06/2017   Anxiety 08/06/2017   Trouble in sleeping 08/06/2017   Palpitations 04/15/2016   Chest pain, atypical 04/15/2016   Chest tightness 04/15/2016   Sinus arrhythmia seen on electrocardiogram 11/10/2015   Migraine 11/08/2014    Past Surgical History:  Procedure Laterality Date   KNEE SURGERY       OB History   No obstetric history on file.     Family History  Problem Relation Age of Onset   Hyperlipidemia Mother    Hypertension Mother    Diabetes Mother    Heart attack Mother    Depression Father      Social History   Tobacco Use   Smoking status: Never Smoker   Smokeless tobacco: Never Used  Substance Use Topics   Alcohol use: Yes    Comment: Rare   Drug use: No    Home Medications Prior to Admission medications   Medication Sig Start Date End Date Taking? Authorizing Provider  bisoprolol (ZEBETA) 5 MG tablet Take 0.5 tablets (2.5 mg total) by mouth daily. 08/25/18   Breeback, Lonna Cobb, PA-C  cyclobenzaprine (FLEXERIL) 10 MG tablet Take 1 tablet (10 mg total) by mouth 3 (three) times daily as needed for muscle spasms. 03/20/18   Breeback, Jade L, PA-C  escitalopram (LEXAPRO) 10 MG tablet Take 1 tablet (10 mg total) by mouth daily. 08/25/18   Breeback, Lonna Cobb, PA-C  Levonorgestrel (KYLEENA) 19.5 MG IUD 1 Device by Intrauterine route once for 1 dose. Inserted 01/20/2017 by Dr. Lyn Hollingshead. 01/20/17 08/25/18  Sunnie Nielsen, DO  LORazepam (ATIVAN) 0.5 MG tablet Take 1 tablet (0.5 mg total) by mouth 2 (two) times daily as needed for anxiety. 08/25/18   Breeback, Jade L, PA-C  naproxen (NAPROSYN) 500 MG tablet Take 1 tablet (500 mg total) by mouth 2 (two) times daily. 09/01/17   Sabas Sous, MD  omeprazole (PRILOSEC) 20 MG capsule Take 1 capsule (20 mg total) by mouth daily. 10/19/18   Jodelle Gross, NP  traMADol (ULTRAM) 50 MG tablet Take 1  tablet (50 mg total) by mouth every 6 (six) hours as needed. 10/19/18   Jodelle Gross, NP    Allergies    Codeine, Cephalexin, and Morphine and related  Review of Systems   Review of Systems  Constitutional: Negative for chills and fever.  HENT: Negative for ear pain and sore throat.   Eyes: Negative for pain and visual disturbance.  Respiratory: Negative for cough and shortness of breath.   Cardiovascular: Negative for chest pain and palpitations.  Gastrointestinal: Negative for abdominal pain and vomiting.  Genitourinary: Negative for dysuria and hematuria.  Musculoskeletal: Negative for arthralgias and back pain.  Skin:  Negative for color change and rash.  Neurological: Positive for speech difficulty (slurred), numbness and headaches. Negative for dizziness, tremors, seizures, syncope, facial asymmetry, weakness and light-headedness.  All other systems reviewed and are negative.   Physical Exam Updated Vital Signs  ED Triage Vitals  Enc Vitals Group     BP 03/04/19 1338 (!) 148/94     Pulse Rate 03/04/19 1338 90     Resp 03/04/19 1338 16     Temp 03/04/19 1338 97.9 F (36.6 C)     Temp Source 03/04/19 1338 Oral     SpO2 03/04/19 1338 99 %     Weight 03/04/19 1338 136 lb 6.4 oz (61.9 kg)     Height 03/04/19 1337 5\' 10"  (1.778 m)     Head Circumference --      Peak Flow --      Pain Score 03/04/19 1338 8     Pain Loc --      Pain Edu? --      Excl. in GC? --     Physical Exam Vitals and nursing note reviewed.  Constitutional:      General: She is not in acute distress.    Appearance: She is well-developed. She is not ill-appearing.  HENT:     Head: Normocephalic and atraumatic.     Nose: Nose normal.     Mouth/Throat:     Mouth: Mucous membranes are moist.  Eyes:     Extraocular Movements: Extraocular movements intact.     Conjunctiva/sclera: Conjunctivae normal.     Pupils: Pupils are equal, round, and reactive to light.  Cardiovascular:     Rate and Rhythm: Normal rate and regular rhythm.     Pulses: Normal pulses.     Heart sounds: Normal heart sounds. No murmur.  Pulmonary:     Effort: Pulmonary effort is normal. No respiratory distress.     Breath sounds: Normal breath sounds.  Abdominal:     General: There is no distension.     Palpations: Abdomen is soft.     Tenderness: There is no abdominal tenderness.  Musculoskeletal:        General: Normal range of motion.     Cervical back: Normal range of motion and neck supple.     Right lower leg: No edema.     Left lower leg: No edema.     Comments: No midline spinal tenderness  Skin:    General: Skin is warm and dry.      Capillary Refill: Capillary refill takes less than 2 seconds.  Neurological:     Mental Status: She is alert and oriented to person, place, and time.     Sensory: Sensory deficit present.     Motor: No weakness.     Coordination: Coordination normal.     Gait: Gait normal.  Comments: Cranial nerves grossly intact except for sensation loss to the left side of her face from forehead down and also with numbness to pinky side of the left hand and numbness to the left lateral thigh but otherwise sensation is intact, 5+ out of 5 strength throughout, no aphasia, normal speech, normal finger-to-nose finger, no visual field deficit  Psychiatric:        Mood and Affect: Mood normal.     ED Results / Procedures / Treatments   Labs (all labs ordered are listed, but only abnormal results are displayed) Labs Reviewed  COMPREHENSIVE METABOLIC PANEL - Abnormal; Notable for the following components:      Result Value   Potassium 3.4 (*)    Glucose, Bld 129 (*)    All other components within normal limits  CBC WITH DIFFERENTIAL/PLATELET  MAGNESIUM  PREGNANCY, URINE  URINALYSIS, ROUTINE W REFLEX MICROSCOPIC    EKG EKG Interpretation  Date/Time:  Thursday March 04 2019 13:46:58 EST Ventricular Rate:  76 PR Interval:    QRS Duration: 110 QT Interval:  397 QTC Calculation: 447 R Axis:   102 Text Interpretation: Sinus rhythm Borderline right axis deviation Low voltage, precordial leads Confirmed by Virgina Norfolk [phone]) on 03/04/2019 1:58:10 PM   Radiology CT Head Wo Contrast  Result Date: 03/04/2019 CLINICAL DATA:  34 year old female with history of tingling in the left side of the face. EXAM: CT HEAD WITHOUT CONTRAST TECHNIQUE: Contiguous axial images were obtained from the base of the skull through the vertex without intravenous contrast. COMPARISON:  No priors. FINDINGS: Brain: No evidence of acute infarction, hemorrhage, hydrocephalus, extra-axial collection or mass lesion/mass  effect. Vascular: No hyperdense vessel or unexpected calcification. Skull: Normal. Negative for fracture or focal lesion. Sinuses/Orbits: No acute finding. Other: None. IMPRESSION: 1. No acute intracranial abnormalities. The appearance of the brain is normal. Electronically Signed   By: Trudie Reed M.D.   On: 03/04/2019 14:28    Procedures Procedures (including critical care time)  Medications Ordered in ED Medications  sodium chloride 0.9 % bolus 1,000 mL (1,000 mLs Intravenous New Bag/Given 03/04/19 1423)  acetaminophen (TYLENOL) tablet 650 mg (650 mg Oral Given 03/04/19 1423)    ED Course  I have reviewed the triage vital signs and the nursing notes.  Pertinent labs & imaging results that were available during my care of the patient were reviewed by me and considered in my medical decision making (see chart for details).    MDM Rules/Calculators/A&P                      Kelsey Dorsey is a 34 year old female history of palpitations who presents the ED with left-sided numbness.  Patient with unremarkable vitals.  No fever.  Symptoms started about 6 hours ago.  She is noticed left lower facial numbness, left hand numbness specifically to the outer part near the pinky, left thigh numbness.  Possibly had slurred speech earlier in the day but has now resolved.  No history of stroke.  No history of recent travel.  No rashes.  No fever, no neck pain.  No concern for meningitis.  No midline spinal pain.  Denies any alcohol or drug use.  Patient has good strength throughout.  Cranial nerves are intact.  Overall has some patchy numbness on exam.  No slurred speech or facial droop.  States that a mild headache started just prior to arrival as well.  No specific migraine history but did have headaches as a  kid.  Possibly a complex migraine.  Seems less likely TIA or stroke.  Does not appear infectious.  No history of MS and no neurological issues like that in the family.  Will obtain basic labs, head  CT.  EKG shows sinus rhythm.  No ischemic changes.  We will have her evaluated by neurology.  Will give Tylenol for headache.  CT scan unremarkable.  No significant electrolyte abnormality, kidney injury, leukocytosis.  Patient to be evaluated by neurology for further recommendations.  Neurology recommends MRI with and without contrast.  Possibly complex migraine versus demyelinating disorder such as MS, less likely stroke.  Have talked with Dr. Eulis Foster at [facility] and patient to be transferred there for MRI study.  Given a headache cocktail with Compazine and Benadryl.  If MRI is positive/still symptomatic recommend neurology consultation at [facility].  This chart was dictated using voice recognition software.  Despite best efforts to proofread,  errors can occur which can change the documentation meaning.     Final Clinical Impression(s) / ED Diagnoses Final diagnoses:  Numbness    Rx / DC Orders ED Discharge Orders    None       Lennice Sites, DO 03/04/19 1516

## 2019-03-04 NOTE — ED Notes (Signed)
Pt used call light to request how much longer her visit would be. This RN went into room and explained that we have to wait until 2230 before she can go to MRI due to pt receiving contrast earlier and per radiology request.

## 2019-03-04 NOTE — Consult Note (Signed)
[facility] TeleNeurology Consult Services  Stat Consult  Date of Service:   03/04/2019 14:35:52  Impression:     .  R20.2 - Paresthesia of skin  Comments/Sign-Out: she does need an MRI of the head to evaluate her for stroke, MS attack etc. If those are completely normal it is possible this could be a migraine aura since she did have a couple brief episodes of disturbance in her visual field today as she does sometimes when she does get a migraine, but that is a diagnosis of exclusion. She will need neurology follow-up  CT HEAD:  CLINICAL DATA: 34 year old female with history of tingling in the left side of the face.   EXAM: CT HEAD WITHOUT CONTRAST   TECHNIQUE: Contiguous axial images were obtained from the base of the skull through the vertex without intravenous contrast.   COMPARISON: No priors.   FINDINGS: Brain: No evidence of acute infarction, hemorrhage, hydrocephalus, extra-axial collection or mass lesion/mass effect.   Vascular: No hyperdense vessel or unexpected calcification.   Skull: Normal. Negative for fracture or focal lesion.   Sinuses/Orbits: No acute finding.   Other: None.   IMPRESSION: 1. No acute intracranial abnormalities. The appearance of the brain is normal.     Electronically Signed By: Vinnie Langton M.D. On: 03/04/2019 14:28  Metrics: [facility] Notification Time: 03/04/2019 14:34:20 Stamp Time: 03/04/2019 14:35:52 Callback Response Time: 03/04/2019 14:37:28  Our recommendations are outlined below.  Imaging Studies:     .  MRI Head with and Without Contrast  Disposition: Neurology Follow Up Recommended  Sign Out:     .  Discussed with Emergency Department Provider  ----------------------------------------------------------------------------------------------------  Chief Complaint: numbness  History of Present Illness: Patient is a 34 year old Female.  34 yo with numbness since this am. She woke up with these  symptoms. She has some numbness of her right forearm and her right thigh. She has a very mild discomfort in her head but nothing that she would call a headache. 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 She certainly does not have the head pain of a typical migraine but she did see a couple of those "floaters" today. She has no other neurologic history.       Examination: BP(148/94), Pulse(90), Blood Glucose(-) 1A: Level of Consciousness - Alert; keenly responsive + 0 1B: Ask Month and Age - Both Questions Right + 0 1C: Blink Eyes & Squeeze Hands - Performs Both Tasks + 0 2: Test Horizontal Extraocular Movements - Normal + 0 3: Test Visual Fields - No Visual Loss + 0 4: Test Facial Palsy (Use Grimace if Obtunded) - Normal symmetry + 0 5A: Test Left Arm Motor Drift - No Drift for 10 Seconds + 0 5B: Test Right Arm Motor Drift - No Drift for 10 Seconds + 0 6A: Test Left Leg Motor Drift - No Drift for 5 Seconds + 0 6B: Test Right Leg Motor Drift - No Drift for 5 Seconds + 0 7: Test Limb Ataxia (FNF/Heel-Shin) - No Ataxia + 0 8: Test Sensation - Normal; No sensory loss + 0 9: Test Language/Aphasia - Normal; No aphasia + 0 10: Test Dysarthria - Normal + 0 11: Test Extinction/Inattention - No abnormality + 0  NIHSS Score: 0    Due to the immediate potential for life-threatening deterioration due to underlying acute neurologic illness, I spent 20 minutes providing critical  care. This time includes time for face to face visit via telemedicine, review of medical records, imaging studies and discussion of findings with providers, the patient and/or family.   Dr Georgina Snell   [facility] [phone]  Case [id number]

## 2019-03-04 NOTE — ED Notes (Signed)
Pt transported to MRI 

## 2019-03-04 NOTE — ED Provider Notes (Signed)
I was asked to evaluate patient by nursing staff as patient is now not wanting to go to [facility] for MRI.  After discussion as well as talk about what we were looking for, she does agree to go to [facility].  I think is reasonable for her to go POV in this setting.  I will replace her potassium though this is probably not causing her numbness at 3.4.  Encouraged her to go to [facility] for the MRI.   Pricilla Loveless, MD 03/04/19 [phone]

## 2019-03-04 NOTE — ED Notes (Signed)
Pt to wait 1 hour before MRI per radiology.

## 2019-03-04 NOTE — Telephone Encounter (Signed)
Pt went to [facility].

## 2019-03-04 NOTE — ED Provider Notes (Signed)
Patient arrived from [facility] awaiting MRI brain. No new symptoms or change in symptoms per patient since leaving [facility]. No needs at this time.   1932hrs patient returns from MRI, MRI brain with and without contrast is unremarkable. Reports slight left frontal headache and slight numbness to pinky side of left hand. No longer having left side facial numbness or thigh numbness. Will discuss with neurology.   2052hrs, discussed with Dr. Otelia Limes with neurology, low suspicion for stroke, possible migraine, recommends MRA brain without contrast for complete work up.  Per MRI, will need 1 hour to clear prior contrast before able to do MRA study.  Care signed out to on coming provider awaiting MRA.   Jeannie Fend, PA-C 03/04/19 2159    Terald Sleeper, MD 03/05/19 [phone]

## 2019-03-04 NOTE — ED Notes (Signed)
Pt returned from MRI °

## 2019-03-04 NOTE — ED Notes (Signed)
Patient transported to MRI 

## 2019-03-04 NOTE — ED Provider Notes (Signed)
11:19 PM Care assumed from [place], [place] at change of shift.  In short, patient is a 34 year old female presenting for left-sided numbness which began at 8:30 AM.  Symptoms later became associated with a headache.  Most of patient's numbness had improved prior to my assumption of her care.  She underwent MRI with and without contrast which shows no evidence of stroke, mass, hemorrhage, hydrocephalus.  Also no findings of ICH.  Neurology was consulted; Dr. Cheral Marker recommended MRI brain prior to discharge.  These imaging findings have been reviewed which are also without evidence of acute process.  Specifically, no aneurysm.  Patient resting comfortably on repeat assessment.  Her vitals have been stable.  Suspect likely complicated migraine with aura.  She will be given follow-up with outpatient neurology.  Return precautions discussed and provided. Patient discharged in stable condition with no unaddressed concerns.   Results for orders placed or performed during the hospital encounter of 03/04/19  Respiratory Panel by RT PCR (Flu A&B, Covid) - Nasopharyngeal Swab   Specimen: Nasopharyngeal Swab  Result Value Ref Range   SARS Coronavirus 2 by RT PCR NEGATIVE NEGATIVE   Influenza A by PCR NEGATIVE NEGATIVE   Influenza B by PCR NEGATIVE NEGATIVE  CBC with Differential  Result Value Ref Range   WBC 8.7 4.0 - 10.5 K/uL   RBC 4.57 3.87 - 5.11 MIL/uL   Hemoglobin 14.4 12.0 - 15.0 g/dL   HCT 43.7 36.0 - 46.0 %   MCV 95.6 80.0 - 100.0 fL   MCH 31.5 26.0 - 34.0 pg   MCHC 33.0 30.0 - 36.0 g/dL   RDW 12.7 11.5 - 15.5 %   Platelets 278 150 - 400 K/uL   nRBC 0.0 0.0 - 0.2 %   Neutrophils Relative % 69 %   Neutro Abs 5.9 1.7 - 7.7 K/uL   Lymphocytes Relative 26 %   Lymphs Abs 2.2 0.7 - 4.0 K/uL   Monocytes Relative 5 %   Monocytes Absolute 0.5 0.1 - 1.0 K/uL   Eosinophils Relative 0 %   Eosinophils Absolute 0.0 0.0 - 0.5 K/uL   Basophils Relative 0 %   Basophils Absolute 0.0 0.0 - 0.1 K/uL   Immature Granulocytes 0 %   Abs Immature Granulocytes 0.03 0.00 - 0.07 K/uL  Comprehensive metabolic panel  Result Value Ref Range   Sodium 135 135 - 145 mmol/L   Potassium 3.4 (L) 3.5 - 5.1 mmol/L   Chloride 99 98 - 111 mmol/L   CO2 28 22 - 32 mmol/L   Glucose, Bld 129 (H) 70 - 99 mg/dL   BUN 10 6 - 20 mg/dL   Creatinine, Ser 0.79 0.44 - 1.00 mg/dL   Calcium 9.3 8.9 - 10.3 mg/dL   Total Protein 7.7 6.5 - 8.1 g/dL   Albumin 4.5 3.5 - 5.0 g/dL   AST 19 15 - 41 U/L   ALT 18 0 - 44 U/L   Alkaline Phosphatase 54 38 - 126 U/L   Total Bilirubin 0.6 0.3 - 1.2 mg/dL   GFR calc non Af Amer >60 >60 mL/min   GFR calc Af Amer >60 >60 mL/min   Anion gap 8 5 - 15  Pregnancy, urine  Result Value Ref Range   Preg Test, Ur NEGATIVE NEGATIVE  Magnesium  Result Value Ref Range   Magnesium 1.7 1.7 - 2.4 mg/dL   CT Head Wo Contrast  Result Date: 03/04/2019 CLINICAL DATA:  34 year old female with history of tingling in the left side of  the face. EXAM: CT HEAD WITHOUT CONTRAST TECHNIQUE: Contiguous axial images were obtained from the base of the skull through the vertex without intravenous contrast. COMPARISON:  No priors. FINDINGS: Brain: No evidence of acute infarction, hemorrhage, hydrocephalus, extra-axial collection or mass lesion/mass effect. Vascular: No hyperdense vessel or unexpected calcification. Skull: Normal. Negative for fracture or focal lesion. Sinuses/Orbits: No acute finding. Other: None. IMPRESSION: 1. No acute intracranial abnormalities. The appearance of the brain is normal. Electronically Signed   By: Trudie Reed M.D.   On: 03/04/2019 14:28   MR ANGIO HEAD WO CONTRAST  Result Date: 03/04/2019 CLINICAL DATA:  Left frontal headache. Left hand numbness. Slurred speech. EXAM: MRA HEAD WITHOUT CONTRAST TECHNIQUE: Angiographic images of the Circle of Willis were obtained using MRA technique without intravenous contrast. COMPARISON:  MRI same day.  CT same day. FINDINGS: Both internal  carotid arteries are widely patent into the brain. No siphon stenosis. The anterior and middle cerebral vessels are patent without proximal stenosis, aneurysm or vascular malformation. Both vertebral arteries are widely patent to the basilar. No basilar stenosis. Posterior circulation branch vessels appear normal. IMPRESSION: Normal examination.  No pathologic vascular finding. Electronically Signed   By: Paulina Fusi M.D.   On: 03/04/2019 23:10   MR Brain W and Wo Contrast  Result Date: 03/04/2019 CLINICAL DATA:  Left-sided numbness, headache EXAM: MRI HEAD WITHOUT AND WITH CONTRAST TECHNIQUE: Multiplanar, multiecho pulse sequences of the brain and surrounding structures were obtained without and with intravenous contrast. CONTRAST:  61mL GADAVIST GADOBUTROL 1 MMOL/ML IV SOLN COMPARISON:  None. FINDINGS: Brain: There is no acute infarction or intracranial hemorrhage. There is no intracranial mass, mass effect, or edema. There is no hydrocephalus or extra-axial fluid collection. No abnormal enhancement. Vascular: Major vessel flow voids at the skull base are preserved. Skull and upper cervical spine: Normal marrow signal is preserved. Sinuses/Orbits: Mild mucosal thickening.  Orbits are unremarkable. Other: Sella is unremarkable.  Mastoid air cells are clear. IMPRESSION: Normal MRI of the brain. Electronically Signed   By: Guadlupe Spanish M.D.   On: 03/04/2019 19:22      Antony Madura, Cordelia Poche 03/04/19 2322    Terald Sleeper, MD 03/05/19 1120

## 2019-03-04 NOTE — ED Triage Notes (Signed)
Tingling to L side of face, arm, and leg that started at 8:30 this morning. States she feels like there are muscle spasms in her face that cause pain to her head and into her neck.

## 2019-03-04 NOTE — ED Notes (Signed)
Pt to CT

## 2019-03-04 NOTE — ED Notes (Signed)
EDP aware of pt complaint

## 2019-03-04 NOTE — ED Notes (Signed)
Pt waiting for MRI.

## 2019-03-05 LAB — URINALYSIS, ROUTINE W REFLEX MICROSCOPIC
Bilirubin Urine: NEGATIVE
Glucose, UA: NEGATIVE mg/dL
Hgb urine dipstick: NEGATIVE
Ketones, ur: NEGATIVE mg/dL
Leukocytes,Ua: NEGATIVE
Nitrite: NEGATIVE
Protein, ur: NEGATIVE mg/dL
Specific Gravity, Urine: 1.008 (ref 1.005–1.030)
pH: 8 (ref 5.0–8.0)

## 2019-03-09 ENCOUNTER — Other Ambulatory Visit: Payer: Self-pay | Admitting: Physician Assistant

## 2019-03-10 ENCOUNTER — Telehealth: Payer: Self-pay | Admitting: Physician Assistant

## 2019-04-22 ENCOUNTER — Telehealth: Payer: Self-pay | Admitting: Physician Assistant

## 2019-04-22 DIAGNOSIS — F419 Anxiety disorder, unspecified: Secondary | ICD-10-CM

## 2019-04-22 NOTE — Telephone Encounter (Signed)
Last filled 08/25/2018 #30 with 2 refills Last appt 08/25/2018

## 2019-04-23 NOTE — Telephone Encounter (Signed)
Attempted to call PT. Left VM to reschedule.

## 2019-04-27 ENCOUNTER — Ambulatory Visit (INDEPENDENT_AMBULATORY_CARE_PROVIDER_SITE_OTHER): Payer: Self-pay | Admitting: Physician Assistant

## 2019-04-27 ENCOUNTER — Other Ambulatory Visit: Payer: Self-pay

## 2019-04-27 ENCOUNTER — Encounter: Payer: Self-pay | Admitting: Physician Assistant

## 2019-04-27 VITALS — BP 113/77 | HR 56 | Ht 70.0 in | Wt 139.0 lb

## 2019-04-27 DIAGNOSIS — Z79899 Other long term (current) drug therapy: Secondary | ICD-10-CM

## 2019-04-27 DIAGNOSIS — Z13220 Encounter for screening for lipoid disorders: Secondary | ICD-10-CM

## 2019-04-27 DIAGNOSIS — R002 Palpitations: Secondary | ICD-10-CM

## 2019-04-27 DIAGNOSIS — F419 Anxiety disorder, unspecified: Secondary | ICD-10-CM

## 2019-04-27 DIAGNOSIS — F341 Dysthymic disorder: Secondary | ICD-10-CM

## 2019-04-27 MED ORDER — LORAZEPAM 0.5 MG PO TABS: 0.5000 mg | ORAL_TABLET | Freq: Two times a day (BID) | ORAL | 1 refills | Status: DC | PRN

## 2019-04-27 MED ORDER — BISOPROLOL FUMARATE 5 MG PO TABS: 2.5000 mg | ORAL_TABLET | Freq: Every day | ORAL | 3 refills | Status: DC

## 2019-04-27 MED ORDER — ESCITALOPRAM OXALATE 10 MG PO TABS: 10.0000 mg | ORAL_TABLET | Freq: Every day | ORAL | 3 refills | Status: DC

## 2019-04-27 NOTE — Progress Notes (Signed)
Subjective:    Patient ID: Kelsey Dorsey, female    DOB: 10/29/1985, 34 y.o.   MRN: [id number]  HPI  Patient is a 34 year old female with dysthymia, anxiety, migraines, intermittent angina with history of sinus arrhythmia and palpitations who presents for medication refill.  Overall her mood is doing well.  She denies any significant problems or changes.  She denies any suicidal thoughts or homicidal idealizations.  She is doing well on Lexapro daily and Ativan as needed.  She does need refills today.  She does have a cardiologist but she does not see them regularly.  They feel like there is no cardiac cause of her intermittent angina and palpitations.  Her palpitations are much better on Zebeta.  She remains on this.  She does need a refill today.  Her mother has an extensive cardiovascular history therefore she is more concerned about this.  .. Active Ambulatory Problems    Diagnosis Date Noted  . Sinus arrhythmia seen on electrocardiogram 11/10/2015  . Palpitations 04/15/2016  . Chest pain, atypical 04/15/2016  . Chest tightness 04/15/2016  . Migraine 11/08/2014  . Dysthymia 08/06/2017  . Anxiety 08/06/2017  . Trouble in sleeping 08/06/2017  . Cervical radiculopathy at C8 03/23/2018  . Angina pectoris (HCC) 03/23/2018   Resolved Ambulatory Problems    Diagnosis Date Noted  . No Resolved Ambulatory Problems   No Additional Past Medical History      Review of Systems  All other systems reviewed and are negative.      Objective:   Physical Exam Vitals reviewed.  Constitutional:      Appearance: Normal appearance.  Cardiovascular:     Rate and Rhythm: Normal rate and regular rhythm.     Pulses: Normal pulses.     Heart sounds: No murmur.  Pulmonary:     Effort: Pulmonary effort is normal.     Breath sounds: Normal breath sounds.  Musculoskeletal:     Right lower leg: No edema.     Left lower leg: No edema.  Neurological:     General: No focal deficit present.     Mental Status: She is alert and oriented to person, place, and time.  Psychiatric:        Mood and Affect: Mood normal.   .. Depression screen [facility] 2/9 04/27/2019 08/25/2018 08/06/2017 10/30/2016 07/18/2016  Decreased Interest 0 1 1 0 0  Down, Depressed, Hopeless 0 0 - 0 0  PHQ - 2 Score 0 1 1 0 0  Altered sleeping 3 1 2  - 1  Tired, decreased energy 2 3 2  - 0  Change in appetite 2 3 3  - 0  Feeling bad or failure about yourself  0 0 2 - 0  Trouble concentrating 2 1 1  - 0  Moving slowly or fidgety/restless 0 0 0 - 0  Suicidal thoughts 0 0 0 - 0  PHQ-9 Score 9 9 11  - 1  Difficult doing work/chores Somewhat difficult Not difficult at all Somewhat difficult - -   . GAD 7 : Generalized Anxiety Score 04/27/2019 08/25/2018 08/06/2017 07/18/2016  Nervous, Anxious, on Edge 1 3 2  0  Control/stop worrying 0 3 2 0  Worry too much - different things 0 3 2 0  Trouble relaxing 1 1 2  0  Restless 1 1 1  0  Easily annoyed or irritable 0 0 2 0  Afraid - awful might happen 0 1 0 0  Total GAD 7 Score 3 12 11  0  Anxiety Difficulty  Not difficult at all Not difficult at all Somewhat difficult Not difficult at all            Assessment & Plan:  Marland KitchenMarland KitchenBrendan was seen today for follow-up.  Diagnoses and all orders for this visit:  Anxiety -     escitalopram (LEXAPRO) 10 MG tablet; Take 1 tablet (10 mg total) by mouth daily. -     LORazepam (ATIVAN) 0.5 MG tablet; Take 1 tablet (0.5 mg total) by mouth 2 (two) times daily as needed for anxiety.  Dysthymia -     escitalopram (LEXAPRO) 10 MG tablet; Take 1 tablet (10 mg total) by mouth daily.  Palpitations -     bisoprolol (ZEBETA) 5 MG tablet; Take 0.5 tablets (2.5 mg total) by mouth daily.  Medication management -     COMPLETE METABOLIC PANEL WITH GFR  Screening for lipid disorders -     Lipid Panel w/reflex Direct LDL   PHQ-9 and GAD-7 both controlled today.  And better than last visit.  Refilled Lexapro and Ativan as needed.  Discussed use of  Ativan very sparingly due to dependence.  Patient is really only taking it once a week or less.  She does not even use her past refills.  BP to goal. HR controlled. Refilled zebeta.   Screening lipid and cmp ordered.

## 2019-08-16 NOTE — Telephone Encounter (Signed)
Done

## 2020-07-26 ENCOUNTER — Encounter: Payer: Self-pay | Admitting: Physician Assistant

## 2020-09-04 ENCOUNTER — Other Ambulatory Visit: Payer: Self-pay | Admitting: Neurology

## 2020-09-04 DIAGNOSIS — F341 Dysthymic disorder: Secondary | ICD-10-CM

## 2020-09-04 DIAGNOSIS — F419 Anxiety disorder, unspecified: Secondary | ICD-10-CM

## 2020-09-04 DIAGNOSIS — R002 Palpitations: Secondary | ICD-10-CM

## 2020-09-04 MED ORDER — LORAZEPAM 0.5 MG PO TABS: 0.5000 mg | ORAL_TABLET | Freq: Two times a day (BID) | ORAL | 0 refills | Status: DC | PRN

## 2020-09-04 MED ORDER — BISOPROLOL FUMARATE 5 MG PO TABS: 2.5000 mg | ORAL_TABLET | Freq: Every day | ORAL | 0 refills | Status: DC

## 2020-09-04 MED ORDER — ESCITALOPRAM OXALATE 10 MG PO TABS: 10.0000 mg | ORAL_TABLET | Freq: Every day | ORAL | 0 refills | Status: DC

## 2020-09-04 NOTE — Telephone Encounter (Signed)
Patient's mother called and left a vm.   They moved patient out of a bad situation this weekend, packed all her things into a storage unit and now she can not find her medication. They are asking for a 2 week supply of bisoprolol, lexapro, and ativan to be sent to [facility]. She is seeing a new doctor in two weeks. Last seen April 2021. Please advise.

## 2020-09-04 NOTE — Telephone Encounter (Signed)
LMOM letting patient know RXs sent.

## 2020-09-27 ENCOUNTER — Other Ambulatory Visit: Payer: Self-pay | Admitting: Physician Assistant

## 2020-09-27 DIAGNOSIS — F419 Anxiety disorder, unspecified: Secondary | ICD-10-CM

## 2020-09-27 DIAGNOSIS — F341 Dysthymic disorder: Secondary | ICD-10-CM

## 2020-09-27 DIAGNOSIS — R002 Palpitations: Secondary | ICD-10-CM

## 2021-09-19 ENCOUNTER — Telehealth: Payer: Self-pay

## 2021-09-19 NOTE — Telephone Encounter (Signed)
Talking with Dr Tenny Craw and finding her a spot to be seen with her (an add in day) for SVT.

## 2021-09-24 NOTE — Telephone Encounter (Signed)
Left a message for the pt to call back for a 10/09/21 appt with Dr Harrington Challenger.

## 2021-09-26 NOTE — Telephone Encounter (Signed)
Left another message for the pt re: making her an appt to see Dr Ross// I am holding a spot for her on her add in day 10/09/21 at 10:40 am.

## 2021-10-03 NOTE — Telephone Encounter (Signed)
Spoke with the pt and she will be seen 10/09/21 by Dr Harrington Challenger.

## 2021-10-09 ENCOUNTER — Ambulatory Visit (INDEPENDENT_AMBULATORY_CARE_PROVIDER_SITE_OTHER): Payer: PRIVATE HEALTH INSURANCE

## 2021-10-09 ENCOUNTER — Encounter: Payer: Self-pay | Admitting: Internal Medicine

## 2021-10-09 ENCOUNTER — Ambulatory Visit: Payer: PRIVATE HEALTH INSURANCE | Attending: Internal Medicine | Admitting: Internal Medicine

## 2021-10-09 VITALS — BP 118/74 | HR 70

## 2021-10-09 DIAGNOSIS — I498 Other specified cardiac arrhythmias: Secondary | ICD-10-CM | POA: Diagnosis not present

## 2021-10-09 DIAGNOSIS — R002 Palpitations: Secondary | ICD-10-CM | POA: Diagnosis not present

## 2021-10-09 DIAGNOSIS — R0789 Other chest pain: Secondary | ICD-10-CM

## 2021-10-09 NOTE — Progress Notes (Unsigned)
Enrolled for [employer] to mail a ZIO XT long term holter monitor to the patients address on file.  

## 2021-10-09 NOTE — Patient Instructions (Signed)
Medication Instructions:   *If you need a refill on your cardiac medications before your next appointment, please call your pharmacy*   Lab Work: NMR, [place]  If you have labs (blood work) drawn today and your tests are completely normal, you will receive your results only by: [place] (if you have MyChart) OR A paper copy in the mail If you have any lab test that is abnormal or we need to change your treatment, we will call you to review the results.   Testing/Procedures: [place]- Long Term Monitor Instructions  Your physician has requested you wear a ZIO patch monitor for 14 days.  This is a single patch monitor. [employer] supplies one patch monitor per enrollment. Additional stickers are not available. Please do not apply patch if you will be having a Nuclear Stress Test,  Echocardiogram, Cardiac CT, MRI, or Chest Xray during the period you would be wearing the  monitor. The patch cannot be worn during these tests. You cannot remove and re-apply the  ZIO XT patch monitor.  Your ZIO patch monitor will be mailed 3 day USPS to your address on file. It may take 3-5 days  to receive your monitor after you have been enrolled.  Once you have received your monitor, please review the enclosed instructions. Your monitor  has already been registered assigning a specific monitor serial # to you.  Billing and Patient Assistance Program Information  We have supplied [employer] with any of your insurance information on file for billing purposes. [employer] offers a sliding scale Patient Assistance Program for patients that do not have  insurance, or whose insurance does not completely cover the cost of the ZIO monitor.  You must apply for the Patient Assistance Program to qualify for this discounted rate.  To apply, please call [employer] at [phone], select option 4, select option 2, ask to apply for  Patient Assistance Program. Theodore Demark will ask your household income, and how many  people  are in your household. They will quote your out-of-pocket cost based on that information.  [employer] will also be able to set up a 41-month, interest-free payment plan if needed.  Applying the monitor   Shave hair from upper left chest.  Hold abrader disc by orange tab. Rub abrader in 40 strokes over the upper left chest as  indicated in your monitor instructions.  Clean area with 4 enclosed alcohol pads. Let dry.  Apply patch as indicated in monitor instructions. Patch will be placed under collarbone on left  side of chest with arrow pointing upward.  Rub patch adhesive wings for 2 minutes. Remove white label marked "1". Remove the white  label marked "2". Rub patch adhesive wings for 2 additional minutes.  While looking in a mirror, press and release button in center of patch. A small green light will  flash 3-4 times. This will be your only indicator that the monitor has been turned on.  Do not shower for the first 24 hours. You may shower after the first 24 hours.  Press the button if you feel a symptom. You will hear a small click. Record Date, Time and  Symptom in the Patient Logbook.  When you are ready to remove the patch, follow instructions on the last 2 pages of Patient  Logbook. Stick patch monitor onto the last page of Patient Logbook.  Place Patient Logbook in the blue and white box. Use locking tab on box and tape box closed  securely. The  blue and white box has prepaid postage on it. Please place it in the mailbox as  soon as possible. Your physician should have your test results approximately 7 days after the  monitor has been mailed back to [facility].  Call [place] at [phone] if you have questions regarding  your ZIO XT patch monitor. Call them immediately if you see an orange light blinking on your  monitor.  If your monitor falls off in less than 4 days, contact our Monitor department at [phone].  If your monitor becomes  loose or falls off after 4 days call [employer] at [phone] for  suggestions on securing your monitor    Follow-Up: At [facility], you and your health needs are our priority.  As part of our continuing mission to provide you with exceptional heart care, we have created designated Provider Care Teams.  These Care Teams include your primary Cardiologist (physician) and Advanced Practice Providers (APPs -  Physician Assistants and Nurse Practitioners) who all work together to provide you with the care you need, when you need it.  We recommend signing up for the patient portal called "MyChart".  Sign up information is provided on this After Visit Summary.  MyChart is used to connect with patients for Virtual Visits (Telemedicine).  Patients are able to view lab/test results, encounter notes, upcoming appointments, etc.  Non-urgent messages can be sent to your provider as well.   To learn more about what you can do with MyChart, go to [email or web address].    REFERRAL TO [place] FOR CHEST WALL PAIN  Important Information About [place]

## 2021-10-09 NOTE — Progress Notes (Signed)
Cardiology Office Note   Date:  10/09/2021   ID:  Kelsey Dorsey, DOB Feb 01, 1985, MRN [id number]  PCP:  Donella Stade, PA-C  Cardiologist:   Dorris Carnes, MD    Patient presents for eval of palpitations and chest pressure    History of Present Illness: Kelsey Dorsey is a 36 y.o. female with a history of palpitations.  She was seen by Elvis Coil and APPS in the past.  She has also been seen at [facility] in October 2022    Echo in 2018  LVEF normal Stress testing;   Pt walked 10 min 48 sec in Bruce protocol   No ischemic chagnes Monitor in 2020 showed occsasional PACs, PVCs.  The patient says she gets spasms in her chest   Wake her from sleep   Pleuritic    L parasternal     Chest spasm  Short lived     Patient says she gets spasms   Sharp   Makes worse with deep breat   Will wake up out of Last minutes to hours   Pt also has had a couple spells of an "elephant pressure" on chst    Last one 6 months ago   Lasted 30 min      The pt is very active   Runs on treadmill 2 miles per day   Also does body pump.      Run on treadmill 2 miles per day   Body pump     Pt has had syncope in past   One a year ago    Developed sharp pain in chest   Got dizzy, passed out      Other was a couple years ago on couch         Current Meds  Medication Sig   buPROPion (WELLBUTRIN XL) 150 MG 24 hr tablet Take 1 tablet by mouth daily.   escitalopram (LEXAPRO) 10 MG tablet Take 1 tablet (10 mg total) by mouth daily.   LORazepam (ATIVAN) 0.5 MG tablet Take 1 tablet (0.5 mg total) by mouth 2 (two) times daily as needed for anxiety.   traZODone (DESYREL) 50 MG tablet Take 1 tablet by mouth at bedtime as needed.     Allergies:   Codeine, Cephalexin, Morphine, and Morphine and related   Past Medical History:  Diagnosis Date   Anxiety    Numbness    Palpitations     Past Surgical History:  Procedure Laterality Date   KNEE SURGERY       Social History:  The patient  reports that she  has never smoked. She has never used smokeless tobacco. She reports current alcohol use. She reports that she does not use drugs.   Family History:  The patient's family history includes Depression in her father; Diabetes in her mother; Heart attack in her mother; Hyperlipidemia in her mother; Hypertension in her mother.    ROS:  Please see the history of present illness. All other systems are reviewed and  Negative to the above problem except as noted.    PHYSICAL EXAM: VS:  BP 118/74 (BP Location: Left Arm, Patient Position: Sitting, Cuff Size: Normal)   Pulse 70   GEN: Well nourished, well developed, in no acute distress  HEENT: normal  Neck: no JVD, carotid bruits, or masses Cardiac: RRR; no murmurs, rubs, or gallops,no edema  Respiratory:  clear to auscultation bilaterally GI: soft, nontender, nondistended, + BS  No hepatomegaly  MS: no deformity Moving  all extremities   Skin: warm and dry, no rash Neuro:  Strength and sensation are intact Psych: euthymic mood, full affect   EKG:  EKG is ordered today.  SR 70   INcomp RBBB   Lipid Panel    Component Value Date/Time   CHOL 151 12/19/2016 1401   TRIG 68 12/19/2016 1401   HDL 91 12/19/2016 1401   CHOLHDL 1.7 12/19/2016 1401   VLDL 20 11/10/2015 1123   LDLCALC 46 12/19/2016 1401      Wt Readings from Last 3 Encounters:  04/27/19 139 lb (63 kg)  03/04/19 136 lb 6.4 oz (61.9 kg)  10/19/18 138 lb 9.6 oz (62.9 kg)      ASSESSMENT AND PLAN:  1  Chest pain.   Pt has had 2 types on chest discomfort   One sharp, pleuritic   The other a pressure, only a couple spells   Both do not sound cardiac in orign   She is very active and has not had spells during activity or that limit her activity    The sharp spells are pleuritic   Seem more musculoskel in origin though she is not tender to touch    Given that she has them often in bed, question if referred pain. Will refer to sports medicine  2   Hx tachycardia   The pt says her  heart races often, that is why she is on a b blocker   Hx does not sound like SVT but I would go ahead an d set up for a monitor  3   Hx of syncope    Associated when have sharp pain   ? Vagal.   Follow   It does not sound like arrhythmia       4  HCM   Will check lipomed, Lpa and Apo B     Follow up based on results     Current medicines are reviewed at length with the patient today.  The patient does not have concerns regarding medicines.  Signed, Dorris Carnes, MD  10/09/2021 9:42 AM    [address], [place], [place]  [address] Phone: [phone]; Fax: [phone]

## 2021-10-10 LAB — NMR, LIPOPROFILE
Cholesterol, Total: 198 mg/dL (ref 100–199)
HDL Particle Number: 46.6 umol/L (ref >=30.5)
HDL-C: 88 mg/dL (ref >39)
LDL Particle Number: 836 nmol/L (ref <1000)
LDL Size: 21 nm (ref >20.5)
LDL-C (NIH Calc): 87 mg/dL (ref 0–99)
LP-IR Score: 50 — ABNORMAL HIGH (ref <=45)
Small LDL Particle Number: 279 nmol/L (ref <=527)
Triglycerides: 137 mg/dL (ref 0–149)

## 2021-10-10 LAB — LIPOPROTEIN A (LPA): Lipoprotein (a): 26.2 nmol/L (ref <75.0)

## 2021-10-10 LAB — APOLIPOPROTEIN B: Apolipoprotein B: 78 mg/dL (ref <90)

## 2021-10-11 DIAGNOSIS — I498 Other specified cardiac arrhythmias: Secondary | ICD-10-CM

## 2021-10-11 DIAGNOSIS — R002 Palpitations: Secondary | ICD-10-CM | POA: Diagnosis not present

## 2021-10-11 DIAGNOSIS — R0789 Other chest pain: Secondary | ICD-10-CM

## 2021-10-18 ENCOUNTER — Encounter: Payer: Self-pay | Admitting: Family Medicine

## 2021-10-19 NOTE — Addendum Note (Signed)
Addended by: Janan Halter F on: 10/19/2021 10:03 AM   Modules accepted: Orders

## 2022-02-21 IMAGING — CT CT HEAD W/O CM
3 series · 16 of 47 positions shown, 19 images · non-contrast
Comparison: No priors.

CLINICAL DATA: 34-year-old female with history of tingling in the
left side of the face.

EXAM:
CT HEAD WITHOUT CONTRAST
TECHNIQUE: Contiguous axial images were obtained from the base of the skull
through the vertex without intravenous contrast.

[Series 2: head wo · axial · 0.40mm/px · z∈[-177,-47]mm · 10 of 32 slices shown, 13 images]
[im 3/32  brain]
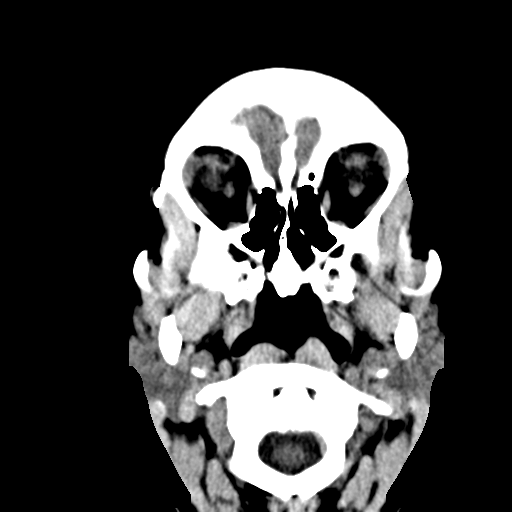
[im 3/32  bone]
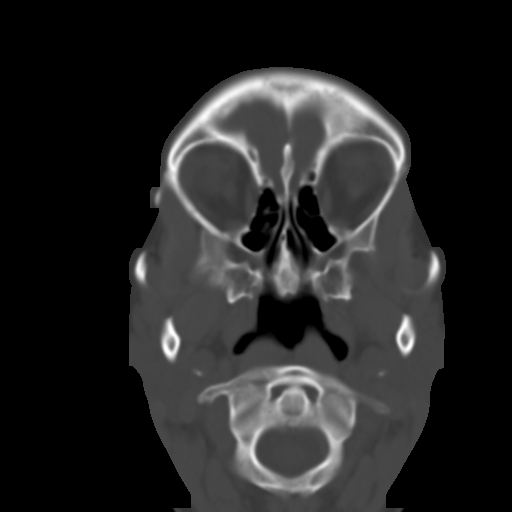
[im 6/32  brain]
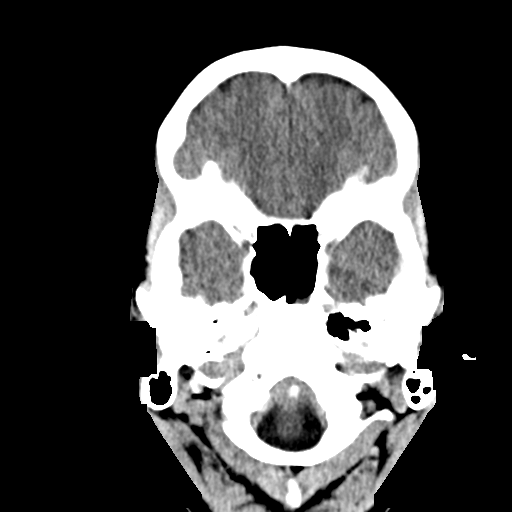
[im 9/32  brain]
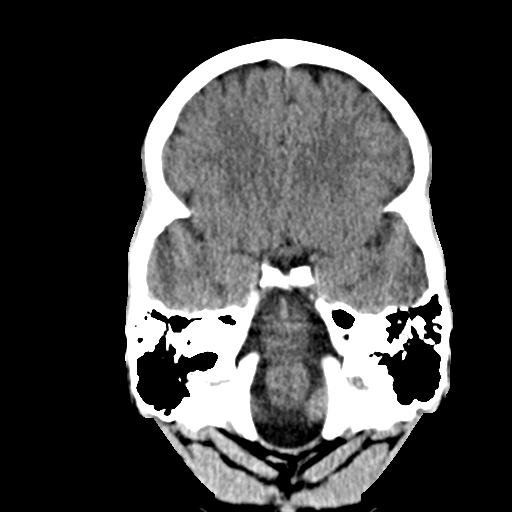
[im 11/32  brain]
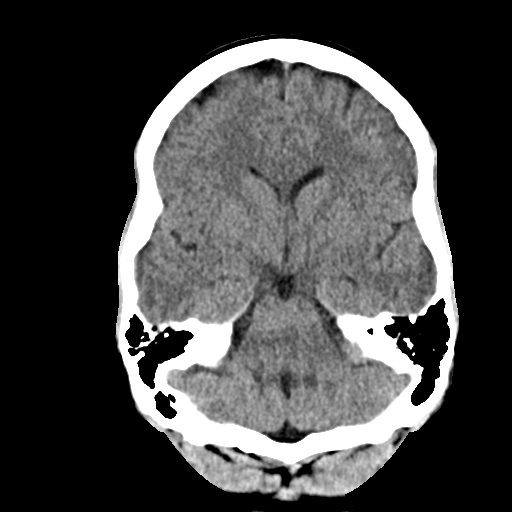
[im 14/32  brain]
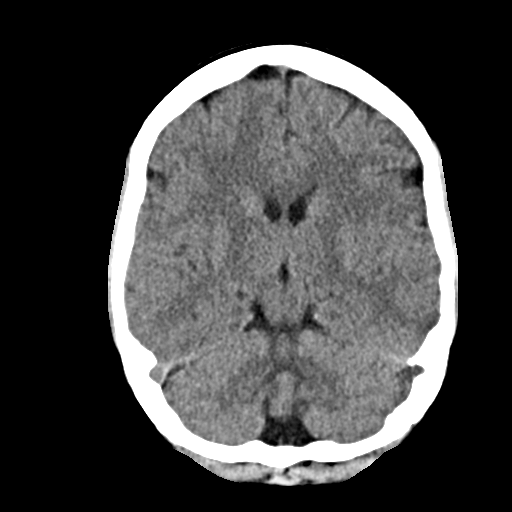
[im 14/32  bone]
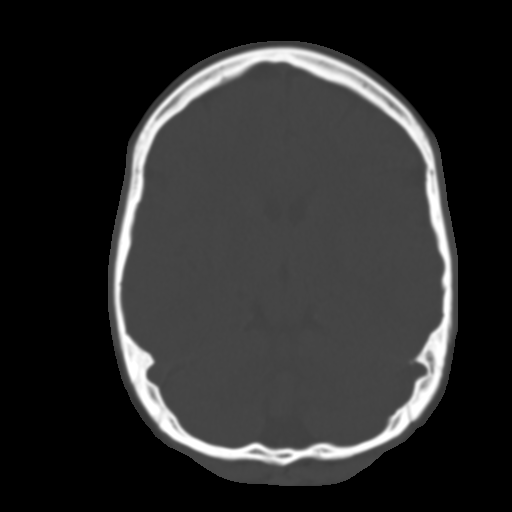
[im 18/32  brain]
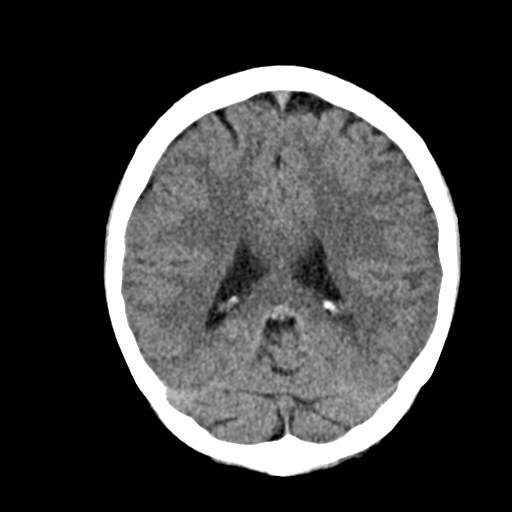
[im 21/32  brain]
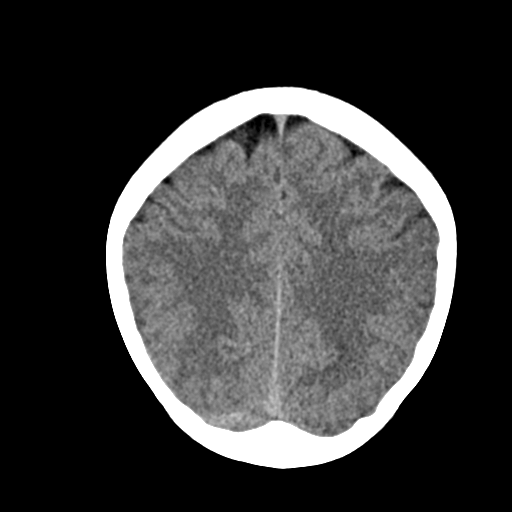
[im 24/32  brain]
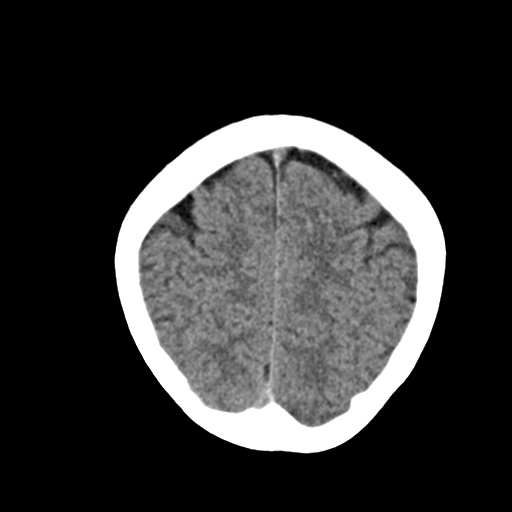
[im 26/32  brain]
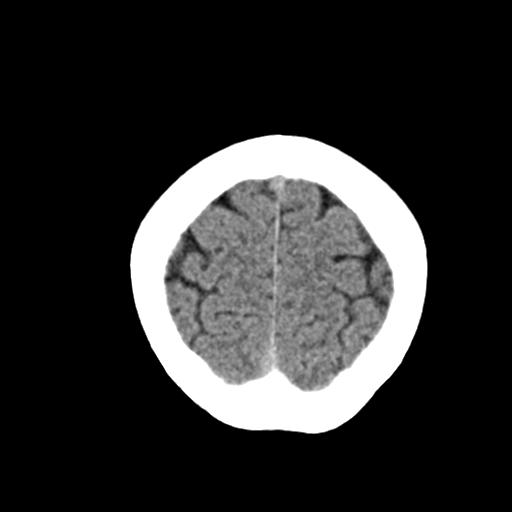
[im 26/32  bone]
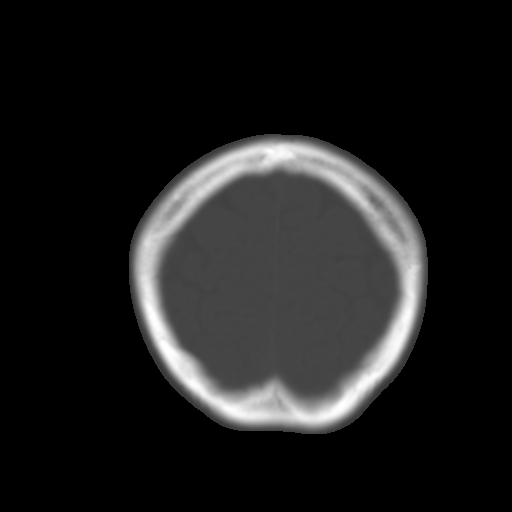
[im 29/32  brain]
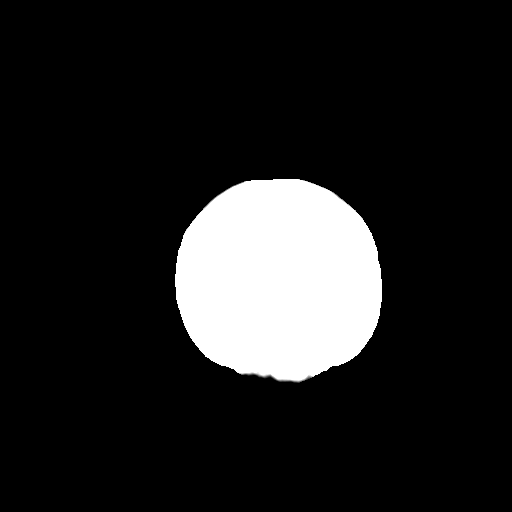

[Series 4: coronal soft · coronal · 0.31mm/px · 3 of 64 slices shown]
[im 22/64  brain]
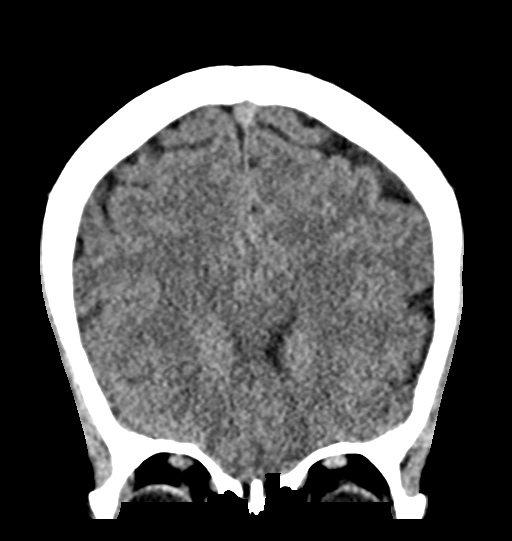
[im 29/64  brain]
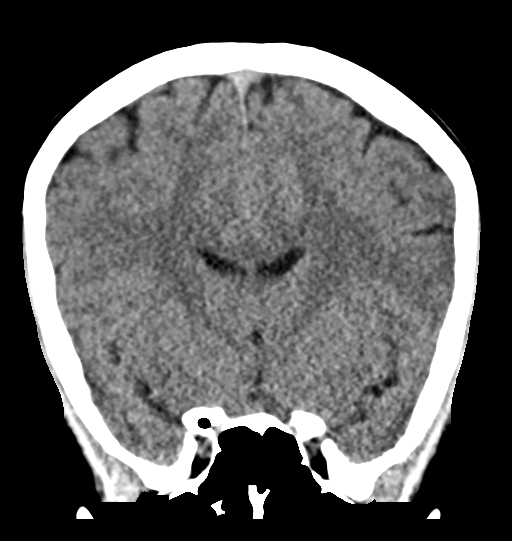
[im 36/64  brain]
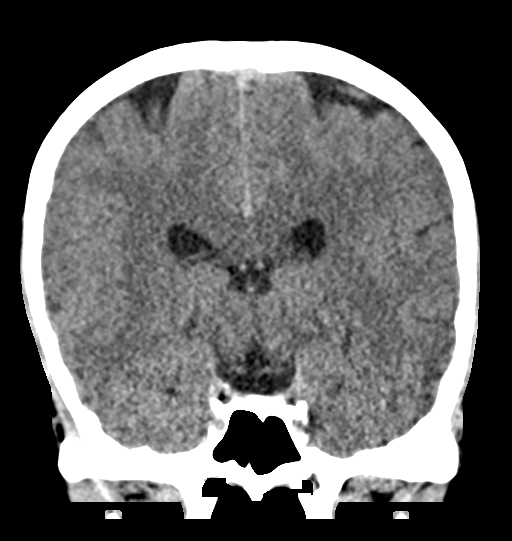

[Series 5: sag soft · sagittal · 0.32mm/px · 3 of 50 slices shown]
[im 17/50  brain]
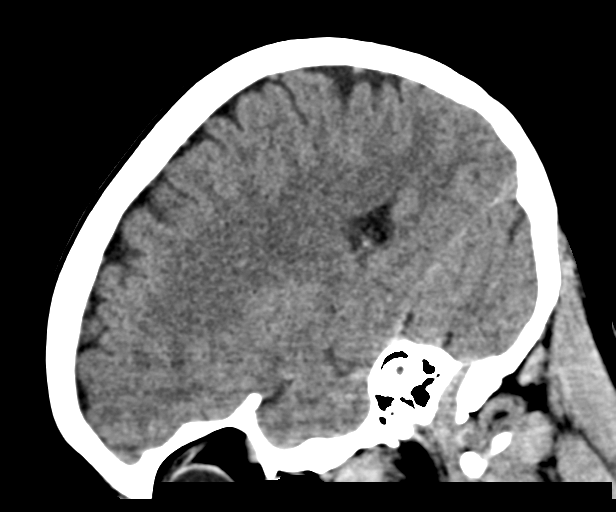
[im 25/50  brain]
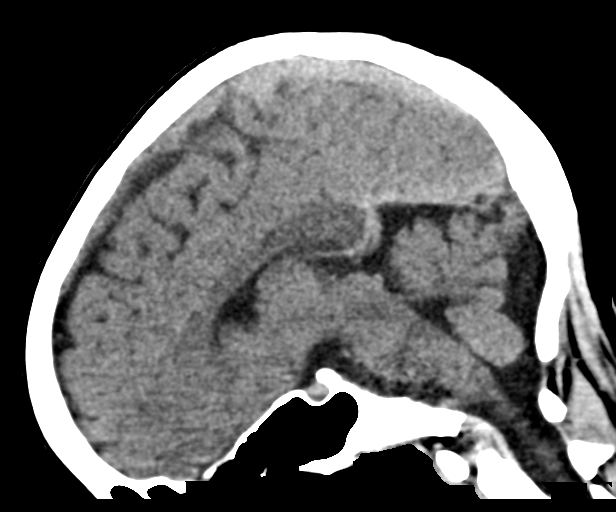
[im 33/50  brain]
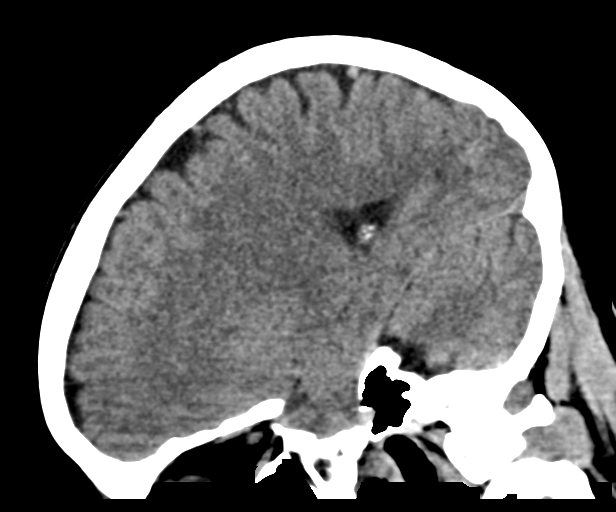

[16 of 47 positions shown; findings below may reference images not displayed]

FINDINGS: Brain: No evidence of acute infarction, hemorrhage, hydrocephalus,
extra-axial collection or mass lesion/mass effect.

Vascular: No hyperdense vessel or unexpected calcification.

Skull: Normal. Negative for fracture or focal lesion.

Sinuses/Orbits: No acute finding.

Other: None.
IMPRESSION: 1. No acute intracranial abnormalities. The appearance of the brain
is normal.

## 2022-12-20 ENCOUNTER — Emergency Department (HOSPITAL_COMMUNITY): Payer: No Typology Code available for payment source

## 2022-12-20 ENCOUNTER — Other Ambulatory Visit: Payer: Self-pay

## 2022-12-20 ENCOUNTER — Encounter (HOSPITAL_COMMUNITY): Payer: Self-pay

## 2022-12-20 ENCOUNTER — Emergency Department (HOSPITAL_COMMUNITY)
Admission: EM | Admit: 2022-12-20 | Discharge: 2022-12-20 | Disposition: A | Discharge status: Home / Self Care | Payer: No Typology Code available for payment source | Attending: Emergency Medicine | Admitting: Emergency Medicine

## 2022-12-20 DIAGNOSIS — R079 Chest pain, unspecified: Secondary | ICD-10-CM | POA: Diagnosis present

## 2022-12-20 DIAGNOSIS — R002 Palpitations: Secondary | ICD-10-CM | POA: Insufficient documentation

## 2022-12-20 DIAGNOSIS — D72829 Elevated white blood cell count, unspecified: Secondary | ICD-10-CM | POA: Insufficient documentation

## 2022-12-20 LAB — CBC
HCT: 44.3 % (ref 36.0–46.0)
Hemoglobin: 14.3 g/dL (ref 12.0–15.0)
MCH: 30 pg (ref 26.0–34.0)
MCHC: 32.3 g/dL (ref 30.0–36.0)
MCV: 92.9 fL (ref 80.0–100.0)
Platelets: 355 10*3/uL (ref 150–400)
RBC: 4.77 MIL/uL (ref 3.87–5.11)
RDW: 13.1 % (ref 11.5–15.5)
WBC: 13.4 10*3/uL — ABNORMAL HIGH (ref 4.0–10.5)
nRBC: 0 % (ref 0.0–0.2)

## 2022-12-20 LAB — BASIC METABOLIC PANEL
Anion gap: 11 (ref 5–15)
BUN: 10 mg/dL (ref 6–20)
CO2: 21 mmol/L — ABNORMAL LOW (ref 22–32)
Calcium: 9.2 mg/dL (ref 8.9–10.3)
Chloride: 106 mmol/L (ref 98–111)
Creatinine, Ser: 0.75 mg/dL (ref 0.44–1.00)
GFR, Estimated: >60 mL/min (ref >60)
Glucose, Bld: 85 mg/dL (ref 70–99)
Potassium: 4.1 mmol/L (ref 3.5–5.1)
Sodium: 138 mmol/L (ref 135–145)

## 2022-12-20 LAB — TROPONIN I (HIGH SENSITIVITY)
Troponin I (High Sensitivity): <2 ng/L (ref <18)
Troponin I (High Sensitivity): <2 ng/L (ref <18)

## 2022-12-20 LAB — HCG, SERUM, QUALITATIVE: Preg, Serum: NEGATIVE

## 2022-12-20 NOTE — ED Triage Notes (Signed)
Coming from work, hx of arrthymia but reports she woke this morning with chest pain that radiates to left shoulder.  Took ASA and nitroglycerin reports nitroglycerin which helped.  +nausea +dizziness +shob

## 2022-12-20 NOTE — ED Provider Notes (Signed)
Kelsey Dorsey   CSN: [id number] Arrival date & time: 12/20/22  1021     History  Chief Complaint  Patient presents with   Chest Pain    Kelsey Dorsey is a 37 y.o. female.  With a history of sinus arrhythmia who presents to the ED for chest pain.  Patient was awakened from sleep around 0700 this morning with acute onset chest discomfort and palpitations.  She took 1 sublingual nitro and 2X 81 mg ASA which help with the discomfort.  Chest pain and palpitations have resolved since coming to the ED.  She has had similar episodes previously for which she has worn a Holter monitor.  No acute events were identified but there was concern for potential SVT or atrial fibrillation.  No diaphoresis nausea vomiting fevers chills or recent illness.  No recent surgery, history of cancer, exogenous estrogen or prior history of venous thromboembolism.  Positive family history of early onset cardiac events (mother MI)   Chest Pain      Home Medications Prior to Admission medications   Medication Sig Start Date End Date Taking? Authorizing Provider  buPROPion (WELLBUTRIN XL) 150 MG 24 hr tablet Take 1 tablet by mouth daily. 11/23/20  Yes [provider]      Allergies    Codeine, Cephalexin, Morphine, and Morphine and codeine    Review of Systems   Review of Systems  Cardiovascular:  Positive for chest pain.    Physical Exam Updated Vital Signs BP 119/77   Pulse 83   Temp 97.8 F (36.6 C)   Resp 15   Ht 5\' 10"  (1.778 m)   Wt 63 kg   SpO2 100%   BMI 19.94 kg/m  Physical Exam Vitals and nursing Dorsey reviewed.  HENT:     Head: Normocephalic and atraumatic.  Eyes:     Pupils: Pupils are equal, round, and reactive to light.  Cardiovascular:     Rate and Rhythm: Normal rate and regular rhythm.  Pulmonary:     Effort: Pulmonary effort is normal.     Breath sounds: Normal breath sounds.  Abdominal:     Palpations:  Abdomen is soft.     Tenderness: There is no abdominal tenderness.  Skin:    General: Skin is warm and dry.  Neurological:     Mental Status: She is alert.  Psychiatric:        Mood and Affect: Mood normal.     ED Results / Procedures / Treatments   Labs (all labs ordered are listed, but only abnormal results are displayed) Labs Reviewed  BASIC METABOLIC PANEL - Abnormal; Notable for the following components:      Result Value   CO2 21 (*)    All other components within normal limits  CBC - Abnormal; Notable for the following components:   WBC 13.4 (*)    All other components within normal limits  HCG, SERUM, QUALITATIVE  TROPONIN I (HIGH SENSITIVITY)  TROPONIN I (HIGH SENSITIVITY)    EKG EKG Interpretation Date/Time:  Friday December 20 2022 10:27:45 EST Ventricular Rate:  109 PR Interval:  154 QRS Duration:  86 QT Interval:  322 QTC Calculation: 433 R Axis:   103  Text Interpretation: Sinus tachycardia Right atrial enlargement Possible Right ventricular hypertrophy Nonspecific T wave abnormality Abnormal ECG When compared with ECG of 04-Mar-2019 13:46, PREVIOUS ECG IS PRESENT Confirmed by Estelle June [phone]) on 12/20/2022 10:54:41 AM  Radiology DG  Chest 2 View Result Date: 12/20/2022 CLINICAL DATA:  Chest pain. EXAM: CHEST - 2 VIEW COMPARISON:  March 14, 2018. FINDINGS: The heart size and mediastinal contours are within normal limits. Both lungs are clear. The visualized skeletal structures are unremarkable. IMPRESSION: No active cardiopulmonary disease. Electronically Signed   By: Lupita Raider M.D.   On: 12/20/2022 11:36    Procedures Ultrasound ED Echo  Date/Time: 12/20/2022 12:31 PM  Performed by: Royanne Foots, DO Authorized by: Royanne Foots, DO   Procedure details:    Indications: chest pain     Views: subxiphoid, parasternal long axis view, parasternal short axis view, apical 4 chamber view and IVC view     Images: archived   Findings:     Pericardium: no pericardial effusion     LV Function: normal (>50% EF)     RV Diameter: normal     IVC: normal   Impression:    Impression: normal       Medications Ordered in ED Medications - No data to display  ED Course/ Medical Decision Making/ A&P Clinical Course as of 12/20/22 1526  Fri Dec 20, 2022  1232 Pregnancy test is negative.  CBC shows slight leukocytosis of 13.  No overt symptoms of acute infection.  Metabolic panel is unremarkable.  Chest x-ray shows no acute consolidations or other concerning findings.  Bedside echo unremarkable with no structural abnormalities.  High sensitive troponin less than 2 not consistent with ACS but given onset of symptoms at 0700 will obtain delta troponin to be sure.  Risk factors include positive family history [MP]  1524 Delta troponin flat.  Patient chest pain-free.  Suspicion for ACS.  Unclear cause of her chest pain today.  Stable for discharge with instruction for close PCP follow-up.  Return precautions discussed in detail. [MP]    Clinical Course User Index [MP] Royanne Foots, DO                                 Medical Decision Making 37 year old female with history as above presenting for chest pain palpitations beginning this morning.  She was awakened by sleep around 0700.  Symptoms have resolved since coming to the emergency department.  Nitro and aspirin prior to arrival.  Normal sinus rhythm with normal rate and normotensive here.  Benign physical exam.  Bedside cardiac ultrasound reveals no gross abnormalities.  Presentation concerning for dysrhythmia and ACS.  Will obtain EKG continue to monitor on telemetry will obtain high sensitive troponin along with basic laboratory workup to evaluate for potential leukocytosis and anemia or electrolyte imbalance.  Amount and/or Complexity of Data Reviewed Labs: ordered. Radiology: ordered.           Final Clinical Impression(s) / ED Diagnoses Final diagnoses:  Chest pain,  unspecified type    Rx / DC Orders ED Discharge Orders     None         Royanne Foots, DO 12/20/22 1526

## 2022-12-20 NOTE — Discharge Instructions (Signed)
You were seen in the emerged part for chest pain We observed you for several hours here on a cardiac monitor and there were no acute events Your blood work, EKG, chest x-ray all looked okay The ultrasound of your heart also looked okay today We are not certain what is causing your chest pain It is important that you follow-up with your primary care doctor 1 week for reevaluation Return to the emergency department for chest pain trouble breathing or any other concerns

## 2023-01-09 NOTE — Progress Notes (Addendum)
Cardiology Office Note:    Date:  01/23/2023   ID:  Kelsey Dorsey, DOB 1985-09-23, MRN [id number]  PCP:  Nolene Ebbs   [facility] Providers Cardiologist:  Dietrich Pates, MD     Referring MD: Jomarie Longs, [place]   Chief Complaint  Patient presents with   Follow-up    ER visit for chest pain    History of Present Illness:    Kelsey Dorsey is a 38 y.o. female with a hx of palpitations. Echo 2018 with preserved EF. POET 2020 was nonischemic. Heart monitor in 2020 showed occasional PACs, PVCs. She is active and runs on treadmill 2 miles per day when seen 10/2021. Also reported chest pain. A repeat heart monitor showed triggered events correlated with NSR.   She was seen in the ER 12/20/22 for chest pain and ruled out with negative trop x 2 and nonischemic EKG.   She returns today for follow up. She remains active - body pump 3 days per week. She continues to have chest pain that at times wakes her from sleep.     Past Medical History:  Diagnosis Date   Anxiety    Numbness    Palpitations     Past Surgical History:  Procedure Laterality Date   KNEE SURGERY      Current Medications: Current Meds  Medication Sig   buPROPion (WELLBUTRIN XL) 150 MG 24 hr tablet Take 1 tablet by mouth daily.   metoprolol tartrate (LOPRESSOR) 50 MG tablet Take 1 tablet 1-2 hours prior to cardiac ct     Allergies:   Codeine, Cephalexin, Morphine, and Morphine and codeine   Social History   Socioeconomic History   Marital status: Single    Spouse name: Not on file   Number of children: Not on file   Years of education: Not on file   Highest education level: Not on file  Occupational History    Comment: Financial planner for [employer]  Tobacco Use   Smoking status: Never   Smokeless tobacco: Never  Vaping Use   Vaping status: Never Used  Substance and Sexual Activity   Alcohol use: Yes    Comment: Rare   Drug use: No   Sexual activity: Not Currently    Birth  control/protection: I.U.D.  Other Topics Concern   Not on file  Social History Narrative   Not on file   Social Drivers of Health   Financial Resource Strain: Not on file  Food Insecurity: Not on file  Transportation Needs: Not on file  Physical Activity: Not on file  Stress: Not on file  Social Connections: Unknown (08/06/2021)   [facility]   Social Network    Social Network: Not on file     Family History: The patient's family history includes Depression in her father; Diabetes in her mother; Heart attack in her mother; Hyperlipidemia in her mother; Hypertension in her mother.  ROS:   Please see the history of present illness.     All other systems reviewed and are negative.  EKGs/Labs/Other Studies Reviewed:    The following studies were reviewed today:   EKG Interpretation Date/Time:  Thursday January 23 2023 10:28:24 EST Ventricular Rate:  69 PR Interval:  148 QRS Duration:  90 QT Interval:  402 QTC Calculation: 430 R Axis:   11  Text Interpretation: Normal sinus rhythm Low voltage QRS Cannot rule out Anterior infarct , age undetermined When compared with ECG  of 20-Dec-2022 10:27, Vent. rate has decreased BY  40 BPM Minimal criteria for Anterior infarct are now Present Confirmed by Micah Flesher ([id number]) on 01/23/2023 10:31:12 AM    Recent Labs: 12/20/2022: BUN 10; Creatinine, Ser 0.75; Hemoglobin 14.3; Platelets 355; Potassium 4.1; Sodium 138  Recent Lipid Panel    Component Value Date/Time   CHOL 151 12/19/2016 1401   TRIG 68 12/19/2016 1401   HDL 91 12/19/2016 1401   CHOLHDL 1.7 12/19/2016 1401   VLDL 20 11/10/2015 1123   LDLCALC 46 12/19/2016 1401     Risk Assessment/Calculations:                Physical Exam:    VS:  BP 112/76   Pulse 69   Ht 5\' 8"  (1.727 m)   Wt 160 lb 9.6 oz (72.8 kg)   SpO2 100%   BMI 24.42 kg/m     Wt Readings from Last 3 Encounters:  01/23/23 160 lb 9.6 oz (72.8 kg)  12/20/22 139 lb  (63 kg)  04/27/19 139 lb (63 kg)     GEN:  Well nourished, well developed in no acute distress HEENT: Normal NECK: No JVD; No carotid bruits LYMPHATICS: No lymphadenopathy CARDIAC: RRR, no murmurs, rubs, gallops RESPIRATORY:  Clear to auscultation without rales, wheezing or rhonchi  ABDOMEN: Soft, non-tender, non-distended MUSCULOSKELETAL:  No edema; No deformity  SKIN: Warm and dry NEUROLOGIC:  Alert and oriented x 3 PSYCHIATRIC:  Normal affect   ASSESSMENT:    1. Chest pain, atypical   2. Medication management   3. Angina pectoris (HCC)   4. Precordial pain   5. Palpitations    PLAN:    In order of problems listed above:   Palpitations, PACs, PVCs - not on BB - reassuring heart monitor   Chest pain - ruled out for ACS, but family history of early onset cardiac events - will obtain CT coronary with 50 mg lopressor   Will draw TSH, A1c, and vitamin D with BMP  Follow up with Dr. Tenny Craw in 4-6 weeks.  ADDENDUM: Apparently insurance company denied her CT coronary.  She is having symptoms concerning for unstable angina and that chest pain wakes her from sleep at times.  She has a strong family history of heart disease.  She has already had an exercise stress test that did not show signs of ischemia.  Despite this test, she continues to have chest pain waking her from sleep.  The next step, per guidelines, would be a CT coronary.  Please resubmit to insurance company.            Medication Adjustments/Labs and Tests Ordered: Current medicines are reviewed at length with the patient today.  Concerns regarding medicines are outlined above.  Orders Placed This Encounter  Procedures   CT CORONARY MORPH W/CTA COR W/SCORE W/CA W/CM &/OR WO/CM   TSH   Basic metabolic panel   Hemoglobin A1c   VITAMIN D 25 Hydroxy (Vit-D Deficiency, Fractures)   EKG 12-Lead   Meds ordered this encounter  Medications   metoprolol tartrate (LOPRESSOR) 50 MG tablet    Sig: Take 1  tablet 1-2 hours prior to cardiac ct    Dispense:  1 tablet    Refill:  0    Patient Instructions  Medication Instructions:  Lopressor 50 mg 1-2 hours prior to Cardiac CT.  *If you need a refill on your cardiac medications before your next appointment, please call your pharmacy*   Lab Work: TSH, BMET,  A1C, Vitamin D 1 week prior to procedure.    Testing/Procedures:   Your cardiac CT will be scheduled at one of the below locations:   [facility] [address] [place], [place] [address] [phone]  If scheduled at [facility], please arrive at the [facility] and Children's Entrance (Entrance C2) of [facility] 30 minutes prior to test start time. You can use the FREE valet parking offered at entrance C (encouraged to control the heart rate for the test)  Proceed to the [facility] Radiology Department (first floor) to check-in and test prep.  All radiology patients and guests should use entrance C2 at [facility], accessed from [facility], even though the hospital's physical address listed is [address]    If scheduled at [facility] or [facility], please arrive 15 mins early for check-in and test prep.  There is spacious parking and easy access to the radiology department from the [facility] and Vascular entrance. Please enter here and check-in with the desk attendant.   Please follow these instructions carefully (unless otherwise directed):  An IV will be required for this test and Nitroglycerin will be given.  Hold all erectile dysfunction medications at least 3 days (72 hrs) prior to test. (Ie viagra, cialis, sildenafil, tadalafil, etc)   On the Night Before the Test: Be sure to Drink plenty of water. Do not consume any caffeinated/decaffeinated beverages or chocolate 12 hours prior to your test. Do not take any antihistamines 12 hours prior to your test. If  the patient has contrast allergy: Patient will need a prescription for Prednisone and very clear instructions (as follows): Prednisone 50 mg - take 13 hours prior to test Take another Prednisone 50 mg 7 hours prior to test Take another Prednisone 50 mg 1 hour prior to test Take Benadryl 50 mg 1 hour prior to test Patient must complete all four doses of above prophylactic medications. Patient will need a ride after test due to Benadryl.  On the Day of the Test: Drink plenty of water until 1 hour prior to the test. Do not eat any food 1 hour prior to test. You may take your regular medications prior to the test.  Take metoprolol (Lopressor) two hours prior to test. If you take Furosemide/Hydrochlorothiazide/Spironolactone/Chlorthalidone, please HOLD on the morning of the test. Patients who wear a continuous glucose monitor MUST remove the device prior to scanning. FEMALES- please wear underwire-free bra if available, avoid dresses & tight clothing  *For Clinical Staff only. Please instruct patient the following:* Heart Rate Medication Recommendations for Cardiac CT  Resting HR < 50 bpm  No medication  Resting HR 50-60 bpm and BP >110/50 mmHG   Consider Metoprolol tartrate 25 mg PO 90-120 min prior to scan  Resting HR 60-65 bpm and BP >110/50 mmHG  Metoprolol tartrate 50 mg PO 90-120 minutes prior to scan   Resting HR > 65 bpm and BP >110/50 mmHG  Metoprolol tartrate 100 mg PO 90-120 minutes prior to scan  Consider Ivabradine 10-15 mg PO or a calcium channel blocker for resting HR >60 bpm and contraindication to metoprolol tartrate  Consider Ivabradine 10-15 mg PO in combination with metoprolol tartrate for HR >80 bpm        After the Test: Drink plenty of water. After receiving IV contrast, you may experience a mild flushed feeling. This is normal. On occasion, you may experience a mild rash up to  24 hours after the test. This is not dangerous. If this occurs, you can take Benadryl 25  mg and increase your fluid intake. If you experience trouble breathing, this can be serious. If it is severe call 911 IMMEDIATELY. If it is mild, please call our office.  We will call to schedule your test 2-4 weeks out understanding that some insurance companies will need an authorization prior to the service being performed.   For more information and frequently asked questions, please visit our website : [email or web address]  For non-scheduling related questions, please contact the cardiac imaging nurse navigator should you have any questions/concerns: Cardiac Imaging Nurse Navigators Direct Office Dial: [phone]   For scheduling needs, including cancellations and rescheduling, please call [place], [phone].    Follow-Up: At [facility], you and your health needs are our priority.  As part of our continuing mission to provide you with exceptional heart care, we have created designated Provider Care Teams.  These Care Teams include your primary Cardiologist (physician) and Advanced Practice Providers (APPs -  Physician Assistants and Nurse Practitioners) who all work together to provide you with the care you need, when you need it.  We recommend signing up for the patient portal called "MyChart".  Sign up information is provided on this After Visit Summary.  MyChart is used to connect with patients for Virtual Visits (Telemedicine).  Patients are able to view lab/test results, encounter notes, upcoming appointments, etc.  Non-urgent messages can be sent to your provider as well.   To learn more about what you can do with MyChart, go to [email or web address].    Your next appointment:   April, 2025  Provider:   Dietrich Pates, MD     Other Instructions          Signed, Marcelino Duster, [place]  01/23/2023 12:26 PM    [facility]

## 2023-01-23 ENCOUNTER — Encounter: Payer: Self-pay | Admitting: Physician Assistant

## 2023-01-23 ENCOUNTER — Ambulatory Visit: Payer: No Typology Code available for payment source | Attending: Physician Assistant | Admitting: Physician Assistant

## 2023-01-23 VITALS — BP 112/76 | HR 69 | Ht 68.0 in | Wt 160.6 lb

## 2023-01-23 DIAGNOSIS — Z79899 Other long term (current) drug therapy: Secondary | ICD-10-CM | POA: Diagnosis not present

## 2023-01-23 DIAGNOSIS — I209 Angina pectoris, unspecified: Secondary | ICD-10-CM | POA: Diagnosis not present

## 2023-01-23 DIAGNOSIS — R002 Palpitations: Secondary | ICD-10-CM

## 2023-01-23 DIAGNOSIS — R0789 Other chest pain: Secondary | ICD-10-CM | POA: Diagnosis not present

## 2023-01-23 DIAGNOSIS — R072 Precordial pain: Secondary | ICD-10-CM | POA: Diagnosis not present

## 2023-01-23 MED ORDER — METOPROLOL TARTRATE 50 MG PO TABS: ORAL_TABLET | ORAL | 0 refills | Status: DC

## 2023-01-23 NOTE — Patient Instructions (Addendum)
Medication Instructions:  Lopressor 50 mg 1-2 hours prior to Cardiac CT.  *If you need a refill on your cardiac medications before your next appointment, please call your pharmacy*   Lab Work: TSH, BMET, A1C, Vitamin D 1 week prior to procedure.    Testing/Procedures:   Your cardiac CT will be scheduled at one of the below locations:   [facility] [address] [place], [place] [address] [phone]  If scheduled at [facility], please arrive at the [facility] and Children's Entrance (Entrance C2) of [facility] 30 minutes prior to test start time. You can use the FREE valet parking offered at entrance C (encouraged to control the heart rate for the test)  Proceed to the [facility] Radiology Department (first floor) to check-in and test prep.  All radiology patients and guests should use entrance C2 at [facility], accessed from [facility], even though the hospital's physical address listed is [address]    If scheduled at [facility] or [facility], please arrive 15 mins early for check-in and test prep.  There is spacious parking and easy access to the radiology department from the [facility] Heart and Vascular entrance. Please enter here and check-in with the desk attendant.   Please follow these instructions carefully (unless otherwise directed):  An IV will be required for this test and Nitroglycerin will be given.  Hold all erectile dysfunction medications at least 3 days (72 hrs) prior to test. (Ie viagra, cialis, sildenafil, tadalafil, etc)   On the Night Before the Test: Be sure to Drink plenty of water. Do not consume any caffeinated/decaffeinated beverages or chocolate 12 hours prior to your test. Do not take any antihistamines 12 hours prior to your test. If the patient has contrast allergy: Patient will need a prescription for Prednisone and very clear  instructions (as follows): Prednisone 50 mg - take 13 hours prior to test Take another Prednisone 50 mg 7 hours prior to test Take another Prednisone 50 mg 1 hour prior to test Take Benadryl 50 mg 1 hour prior to test Patient must complete all four doses of above prophylactic medications. Patient will need a ride after test due to Benadryl.  On the Day of the Test: Drink plenty of water until 1 hour prior to the test. Do not eat any food 1 hour prior to test. You may take your regular medications prior to the test.  Take metoprolol (Lopressor) two hours prior to test. If you take Furosemide/Hydrochlorothiazide/Spironolactone/Chlorthalidone, please HOLD on the morning of the test. Patients who wear a continuous glucose monitor MUST remove the device prior to scanning. FEMALES- please wear underwire-free bra if available, avoid dresses & tight clothing  *For Clinical Staff only. Please instruct patient the following:* Heart Rate Medication Recommendations for Cardiac CT  Resting HR < 50 bpm  No medication  Resting HR 50-60 bpm and BP >110/50 mmHG   Consider Metoprolol tartrate 25 mg PO 90-120 min prior to scan  Resting HR 60-65 bpm and BP >110/50 mmHG  Metoprolol tartrate 50 mg PO 90-120 minutes prior to scan   Resting HR > 65 bpm and BP >110/50 mmHG  Metoprolol tartrate 100 mg PO 90-120 minutes prior to scan  Consider Ivabradine 10-15 mg PO or a calcium channel blocker for resting HR >60 bpm and contraindication to metoprolol tartrate  Consider Ivabradine 10-15 mg PO in combination with metoprolol tartrate for HR >80 bpm  After the Test: Drink plenty of water. After receiving IV contrast, you may experience a mild flushed feeling. This is normal. On occasion, you may experience a mild rash up to 24 hours after the test. This is not dangerous. If this occurs, you can take Benadryl 25 mg and increase your fluid intake. If you experience trouble breathing, this can be serious. If  it is severe call 911 IMMEDIATELY. If it is mild, please call our office.  We will call to schedule your test 2-4 weeks out understanding that some insurance companies will need an authorization prior to the service being performed.   For more information and frequently asked questions, please visit our website : [email or web address]  For non-scheduling related questions, please contact the cardiac imaging nurse navigator should you have any questions/concerns: Cardiac Imaging Nurse Navigators Direct Office Dial: [phone]   For scheduling needs, including cancellations and rescheduling, please call [place], [phone].    Follow-Up: At [facility], you and your health needs are our priority.  As part of our continuing mission to provide you with exceptional heart care, we have created designated Provider Care Teams.  These Care Teams include your primary Cardiologist (physician) and Advanced Practice Providers (APPs -  Physician Assistants and Nurse Practitioners) who all work together to provide you with the care you need, when you need it.  We recommend signing up for the patient portal called "MyChart".  Sign up information is provided on this After Visit Summary.  MyChart is used to connect with patients for Virtual Visits (Telemedicine).  Patients are able to view lab/test results, encounter notes, upcoming appointments, etc.  Non-urgent messages can be sent to your provider as well.   To learn more about what you can do with MyChart, go to [email or web address].    Your next appointment:   April, 2025  Provider:   Dietrich Pates, MD     Other Instructions

## 2023-02-13 LAB — BASIC METABOLIC PANEL
BUN/Creatinine Ratio: 13 (ref 9–23)
BUN: 11 mg/dL (ref 6–24)
CO2: 20 mmol/L (ref 20–29)
Calcium: 9.9 mg/dL (ref 8.7–10.2)
Chloride: 102 mmol/L (ref 96–106)
Creatinine, Ser: 0.86 mg/dL (ref 0.57–1.00)
Glucose: 102 mg/dL — ABNORMAL HIGH (ref 70–99)
Potassium: 4.7 mmol/L (ref 3.5–5.2)
Sodium: 139 mmol/L (ref 134–144)
eGFR: 88 mL/min/{1.73_m2} (ref >59)

## 2023-02-13 LAB — TSH: TSH: 1.49 u[IU]/mL (ref 0.450–4.500)

## 2023-02-13 LAB — HEMOGLOBIN A1C
Est. average glucose Bld gHb Est-mCnc: 105 mg/dL
Hgb A1c MFr Bld: 5.3 % (ref 4.8–5.6)

## 2023-02-24 ENCOUNTER — Other Ambulatory Visit (HOSPITAL_COMMUNITY): Payer: No Typology Code available for payment source

## 2023-04-01 ENCOUNTER — Telehealth (HOSPITAL_COMMUNITY): Payer: Self-pay | Admitting: *Deleted

## 2023-04-01 NOTE — Telephone Encounter (Signed)
 Recieved call from patient regarding upcoming cardiac imaging study; pt verbalizes understanding of appt date/time, parking situation and where to check in, pre-test NPO status and medications ordered, and verified current allergies; name and call back number provided for further questions should they arise Johney Frame RN Navigator Cardiac Imaging [facility] [phone] office [phone] cell

## 2023-04-01 NOTE — Telephone Encounter (Signed)
 Attempted to call patient regarding upcoming cardiac CT appointment. Left message on voicemail with name and callback number Johney Frame RN Navigator Cardiac Imaging [facility] [phone] Office

## 2023-04-02 ENCOUNTER — Ambulatory Visit (HOSPITAL_COMMUNITY)
Admission: RE | Admit: 2023-04-02 | Discharge: 2023-04-02 | Disposition: A | Discharge status: Home / Self Care | Source: Ambulatory Visit | Attending: Physician Assistant | Admitting: Physician Assistant

## 2023-04-02 DIAGNOSIS — R0789 Other chest pain: Secondary | ICD-10-CM | POA: Insufficient documentation

## 2023-04-02 DIAGNOSIS — I209 Angina pectoris, unspecified: Secondary | ICD-10-CM | POA: Insufficient documentation

## 2023-04-02 DIAGNOSIS — R072 Precordial pain: Secondary | ICD-10-CM | POA: Insufficient documentation

## 2023-04-02 MED ORDER — IOHEXOL 350 MG/ML SOLN
95.0000 mL | Freq: Once | INTRAVENOUS | Status: AC | PRN
  Administered 2023-04-02: 95 mL via INTRAVENOUS

## 2023-04-02 MED ORDER — NITROGLYCERIN 0.4 MG SL SUBL
0.8000 mg | SUBLINGUAL_TABLET | Freq: Once | SUBLINGUAL | Status: AC
  Administered 2023-04-02: 0.8 mg via SUBLINGUAL

## 2023-04-02 MED ORDER — NITROGLYCERIN 0.4 MG SL SUBL
SUBLINGUAL_TABLET | SUBLINGUAL | Status: AC
Start: 2023-04-02 — End: ?
  Filled 2023-04-02: qty 2

## 2023-04-06 NOTE — Progress Notes (Unsigned)
 Cardiology Office Note   Date:  04/08/2023   ID:  Kelsey Dorsey, DOB 03/15/1985, MRN [id number]  PCP:  Jomarie Longs, PA-C  Cardiologist:   Dietrich Pates, MD    Patient presents for eval of palpitations and chest pressure    History of Present Illness: Kelsey Dorsey is a 38 y.o. female with a history of palpitations, chest pain and episode of syncope (with chest pain).  She was seen by [facility] and [facility] in the past.  She has also been seen at [facility] in October 2022    Echo in 2018  LVEF normal Stress testing;   Pt walked 10 min 48 sec in Bruce protocol   No ischemic chagnes Monitor in 2020 showed occsasional PACs, PVCs.   The pt is very active   Runs on treadmill 2 miles per day   Also does body pump.          I saww the pt in 2023 in December 2024 the patient was seen in the emergency room for an episode of chest pain.  The patient woke up with chest pain.  She ended up going to work told them she was having the pains took aspirin and nitroglycerin with little relief went to the emergency room.  She says the pain was sharp, left parasternal came in bursts.  She was sent home.  Seen by Bettina Gavia after that visit in early 2025.  The patient was set up for a CT coronary angiogram.  This was done in March.  Calcium score was 0.  She had no coronary artery disease.    The patient says she has not had any chest pain since.  She denies palpitations.  Breathing is okay.   She remains very active.   Current Meds  Medication Sig   amLODipine (NORVASC) 2.5 MG tablet Take 1 tablet (2.5 mg total) by mouth daily.   sertraline (ZOLOFT) 50 MG tablet Take 75 mg by mouth daily.     Allergies:   Codeine, Cephalexin, Morphine, and Morphine and codeine   Past Medical History:  Diagnosis Date   Anxiety    Numbness    Palpitations     Past Surgical History:  Procedure Laterality Date   KNEE SURGERY       Social History:  The patient  reports that she has never smoked. She  has never used smokeless tobacco. She reports current alcohol use. She reports that she does not use drugs.   Family History:  The patient's family history includes Depression in her father; Diabetes in her mother; Heart attack in her mother; Hyperlipidemia in her mother; Hypertension in her mother.    ROS:  Please see the history of present illness. All other systems are reviewed and  Negative to the above problem except as noted.    PHYSICAL EXAM: VS:  BP 122/80 (BP Location: Right Arm, Patient Position: Sitting, Cuff Size: Normal)   Pulse 67   Ht 5\' 9"  (1.753 m)   Wt 71.5 kg   SpO2 98%   BMI 23.27 kg/m   GEN: Well nourished, well developed, in no acute distress  HEENT: normal  Neck: no JVD, carotid bruits Cardiac: RRR; no murmurs.  No lower extremity edema  Respiratory:  clear to auscultation GI: soft, nontender, nondistended, + BS  No hepatomegaly  MS: no deformity Moving all extremities   Skin: warm and dry, no rash Neuro:  Strength and sensation are intact Psych: euthymic mood, full  affect   EKG:  EKG is not done   Lipid Panel    Component Value Date/Time   CHOL 151 12/19/2016 1401   TRIG 68 12/19/2016 1401   HDL 91 12/19/2016 1401   CHOLHDL 1.7 12/19/2016 1401   VLDL 20 11/10/2015 1123   LDLCALC 46 12/19/2016 1401      Wt Readings from Last 3 Encounters:  04/08/23 71.5 kg  01/23/23 72.8 kg  12/20/22 63 kg      ASSESSMENT AND PLAN:  1  Chest pain.  Patient has a long history of chest pain.  2 types of pain 1 is sharp.  1 is more pressure.  Pt has had 2 types on chest discomfort   One sharp, pleuritic   The other a pressure.  Not associated with activity.  She remains very active  Recently seen in the emergency room went on to have a CT coronary angiogram that showed normal coronaries.  She could have small vessel disease or spasm but I am not convinced by the history.  I have recommended she try very low-dose of amlodipine 1.5 to 2.5 mg.  Follow-up with  MyChart with her response   2   Hx tachycardia.  Patient had a history of heart racing.  No significant arrhythmias have been documented \\  3   Hx of syncope    Associated when have sharp pain   ? Vagal.   No recurrent spells  4  HCM   lipomata panel in 2023, LDL 87 with 836 particles and small particle number of 279.  LP(a) of 26.  Will be available as needed.   Current medicines are reviewed at length with the patient today.  The patient does not have concerns regarding medicines.  Signed, Dietrich Pates, MD  04/08/2023 9:04 PM    [facility] [address] [place], [place], [place]  [address] Phone: [phone]; Fax: [phone]

## 2023-04-08 ENCOUNTER — Ambulatory Visit: Payer: No Typology Code available for payment source | Attending: Internal Medicine | Admitting: Internal Medicine

## 2023-04-08 VITALS — BP 122/80 | HR 67 | Ht 69.0 in | Wt 157.6 lb

## 2023-04-08 DIAGNOSIS — R079 Chest pain, unspecified: Secondary | ICD-10-CM

## 2023-04-08 MED ORDER — AMLODIPINE BESYLATE 2.5 MG PO TABS: 2.5000 mg | ORAL_TABLET | Freq: Every day | ORAL | 3 refills | Status: AC

## 2023-04-08 NOTE — Patient Instructions (Signed)
 Medication Instructions:  START AMLODIPINE 2.5 MG A DAY  *If you need a refill on your cardiac medications before your next appointment, please call your pharmacy*  Lab Work:  If you have labs (blood work) drawn today and your tests are completely normal, you will receive your results only by: MyChart Message (if you have MyChart) OR A paper copy in the mail If you have any lab test that is abnormal or we need to change your treatment, we will call you to review the results.  Testing/Procedures:   Follow-Up: At [facility], you and your health needs are our priority.  As part of our continuing mission to provide you with exceptional heart care, our providers are all part of one team.  This team includes your primary Cardiologist (physician) and Advanced Practice Providers or APPs (Physician Assistants and Nurse Practitioners) who all work together to provide you with the care you need, when you need it.  We recommend signing up for the patient portal called "MyChart".  Sign up information is provided on this After Visit Summary.  MyChart is used to connect with patients for Virtual Visits (Telemedicine).  Patients are able to view lab/test results, encounter notes, upcoming appointments, etc.  Non-urgent messages can be sent to your provider as well.   To learn more about what you can do with MyChart, go to [email or web address].   Other Instructions       1st Floor: - Lobby - Registration  - Pharmacy  - Lab - Cafe  2nd Floor: - PV Lab - Diagnostic Testing (echo, CT, nuclear med)  3rd Floor: - Vacant  4th Floor: - TCTS (cardiothoracic surgery) - AFib Clinic - Structural Heart Clinic - Vascular Surgery  - Vascular Ultrasound  5th Floor: - [facility] (general and EP) - Clinical Pharmacy for coumadin, hypertension, lipid, weight-loss medications, and med management appointments    Valet parking services will be available as well.
